# Patient Record
Sex: Female | Born: 1995 | Hispanic: Yes | Marital: Married | Smoking: Never smoker
Health system: Southern US, Community
[De-identification: ages and names within clinical notes are randomized; demographics above are authoritative.]

## PROBLEM LIST (undated history)

## (undated) DIAGNOSIS — I10 Essential (primary) hypertension: Secondary | ICD-10-CM

## (undated) HISTORY — PX: APPENDECTOMY: SHX54

## (undated) HISTORY — DX: Essential (primary) hypertension: I10

---
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
## 2020-09-19 NOTE — L&D Delivery Note (Signed)
OB/GYN Faculty Practice Delivery Note  Melanie Watts is a 25 y.o. G2P1001 s/p NSVD (successful VBAC) at [redacted]w[redacted]d. She was admitted for PROM.   ROM: 12h 22m with clear fluid GBS Status:  Negative/-- (08/05 1058) Maximum Maternal Temperature: 98.49F  Labor Progress: Initial SVE: 1/thick/high. She then progressed to complete.   Delivery Date/Time: 14:37 on 05/04/2021  Delivery: Called to room and patient was complete and pushing. Head delivered LOA. Single loose nuchal cord present, reduced after delivery easily. Shoulder and body delivered in usual fashion. Infant with spontaneous cry, placed on mother's abdomen, dried and stimulated. Cord clamped x 2 after 1-minute delay, and cut by FOB. Cord blood drawn. Placenta delivered spontaneously with gentle cord traction. Fundus firm with massage and Pitocin. Labia, perineum, vagina, and cervix inspected inspected with left periurethral laceration and intact perineum.  Baby Weight: pending  Placenta: Sent to L&D Complications: None Lacerations: Left periurethral repaired with 4-0 monocryl; single figure-eight stitch at hymenal ring for good hemostasis. EBL: 100 mL Analgesia: Epidural   Infant:  APGAR (1 MIN): 9   APGAR (5 MINS): 9   APGAR (10 MINS):     Dr. Dorothyann Gibbs, PGY-1 [facility] Resident present and assisted with delivery. I personally was present and supervised entire delivery, and performed laceration repair.   Jen Mow, DO [facility] for [employer], [facility] 05/04/2021, 3:34 PM

## 2020-11-09 LAB — OB RESULTS CONSOLE VARICELLA ZOSTER ANTIBODY, IGG: Varicella: IMMUNE

## 2020-11-09 LAB — OB RESULTS CONSOLE HIV ANTIBODY (ROUTINE TESTING): HIV: NONREACTIVE

## 2020-11-09 LAB — OB RESULTS CONSOLE RUBELLA ANTIBODY, IGM: Rubella: IMMUNE

## 2020-11-09 LAB — OB RESULTS CONSOLE HGB/HCT, BLOOD
HCT: 37 (ref 29–41)
Hemoglobin: 12.5
Hemoglobin: 12.5

## 2020-11-09 LAB — OB RESULTS CONSOLE ABO/RH: RH Type: POSITIVE

## 2020-11-09 LAB — HEPATITIS C ANTIBODY: HCV Ab: NEGATIVE

## 2020-11-09 LAB — OB RESULTS CONSOLE GC/CHLAMYDIA
Chlamydia: NEGATIVE
Gonorrhea: NEGATIVE

## 2020-11-09 LAB — OB RESULTS CONSOLE RPR: RPR: NONREACTIVE

## 2020-11-09 LAB — CYSTIC FIBROSIS DIAGNOSTIC STUDY: Interpretation-CFDNA:: NEGATIVE

## 2020-11-09 LAB — OB RESULTS CONSOLE ANTIBODY SCREEN: Antibody Screen: NEGATIVE

## 2020-11-09 LAB — AMB REFERRAL TO OB-GYN
Maternal quad screen for MFM: NEGATIVE
Pap: NEGATIVE

## 2020-11-09 LAB — OB RESULTS CONSOLE HEPATITIS B SURFACE ANTIGEN: Hepatitis B Surface Ag: NEGATIVE

## 2020-11-09 LAB — OB RESULTS CONSOLE PLATELET COUNT: Platelets: 231

## 2020-11-09 LAB — SICKLE CELL SCREEN: Sickle Cell Screen: NORMAL

## 2020-12-26 ENCOUNTER — Encounter (HOSPITAL_COMMUNITY): Payer: Self-pay | Admitting: Family Medicine

## 2020-12-26 ENCOUNTER — Inpatient Hospital Stay (HOSPITAL_COMMUNITY)
Admission: AD | Admit: 2020-12-26 | Discharge: 2020-12-26 | Disposition: A | Discharge status: Home / Self Care | Payer: Self-pay | Attending: Family Medicine | Admitting: Family Medicine

## 2020-12-26 ENCOUNTER — Other Ambulatory Visit: Payer: Self-pay

## 2020-12-26 ENCOUNTER — Encounter (HOSPITAL_COMMUNITY): Payer: Self-pay

## 2020-12-26 ENCOUNTER — Ambulatory Visit (HOSPITAL_COMMUNITY)
Admission: EM | Admit: 2020-12-26 | Discharge: 2020-12-26 | Disposition: A | Discharge status: Home / Self Care | Payer: Self-pay

## 2020-12-26 DIAGNOSIS — O09292 Supervision of pregnancy with other poor reproductive or obstetric history, second trimester: Secondary | ICD-10-CM | POA: Insufficient documentation

## 2020-12-26 DIAGNOSIS — O2342 Unspecified infection of urinary tract in pregnancy, second trimester: Secondary | ICD-10-CM | POA: Insufficient documentation

## 2020-12-26 DIAGNOSIS — Z3201 Encounter for pregnancy test, result positive: Secondary | ICD-10-CM

## 2020-12-26 DIAGNOSIS — O209 Hemorrhage in early pregnancy, unspecified: Secondary | ICD-10-CM | POA: Insufficient documentation

## 2020-12-26 DIAGNOSIS — Z3A20 20 weeks gestation of pregnancy: Secondary | ICD-10-CM | POA: Insufficient documentation

## 2020-12-26 DIAGNOSIS — Z9049 Acquired absence of other specified parts of digestive tract: Secondary | ICD-10-CM | POA: Insufficient documentation

## 2020-12-26 LAB — CBC WITH DIFFERENTIAL/PLATELET
Abs Immature Granulocytes: 0.06 10*3/uL (ref 0.00–0.07)
Basophils Absolute: 0.1 10*3/uL (ref 0.0–0.1)
Basophils Relative: 0 %
Eosinophils Absolute: 0.2 10*3/uL (ref 0.0–0.5)
Eosinophils Relative: 1 %
HCT: 36.4 % (ref 36.0–46.0)
Hemoglobin: 12.7 g/dL (ref 12.0–15.0)
Immature Granulocytes: 0 %
Lymphocytes Relative: 22 %
Lymphs Abs: 3.1 10*3/uL (ref 0.7–4.0)
MCH: 30.8 pg (ref 26.0–34.0)
MCHC: 34.9 g/dL (ref 30.0–36.0)
MCV: 88.3 fL (ref 80.0–100.0)
Monocytes Absolute: 0.8 10*3/uL (ref 0.1–1.0)
Monocytes Relative: 6 %
Neutro Abs: 9.8 10*3/uL — ABNORMAL HIGH (ref 1.7–7.7)
Neutrophils Relative %: 71 %
Platelets: 239 10*3/uL (ref 150–400)
RBC: 4.12 MIL/uL (ref 3.87–5.11)
RDW: 13.6 % (ref 11.5–15.5)
WBC: 14 10*3/uL — ABNORMAL HIGH (ref 4.0–10.5)
nRBC: 0 % (ref 0.0–0.2)

## 2020-12-26 LAB — ABO/RH: ABO/RH(D): A POS

## 2020-12-26 LAB — URINALYSIS, ROUTINE W REFLEX MICROSCOPIC
Bilirubin Urine: NEGATIVE
Glucose, UA: NEGATIVE mg/dL
Ketones, ur: NEGATIVE mg/dL
Nitrite: NEGATIVE
Protein, ur: NEGATIVE mg/dL
Specific Gravity, Urine: 1.004 — ABNORMAL LOW (ref 1.005–1.030)
WBC, UA: >50 WBC/hpf — ABNORMAL HIGH (ref 0–5)
pH: 6 (ref 5.0–8.0)

## 2020-12-26 LAB — WET PREP, GENITAL
Clue Cells Wet Prep HPF POC: NONE SEEN
Sperm: NONE SEEN
Trich, Wet Prep: NONE SEEN
Yeast Wet Prep HPF POC: NONE SEEN

## 2020-12-26 LAB — POC URINE PREG, ED: Preg Test, Ur: POSITIVE — AB

## 2020-12-26 MED ORDER — ONDANSETRON 4 MG PO TBDP: 4.0000 mg | ORAL_TABLET | Freq: Four times a day (QID) | ORAL | 0 refills | Status: DC | PRN

## 2020-12-26 MED ORDER — CEFADROXIL 500 MG PO CAPS: 500.0000 mg | ORAL_CAPSULE | Freq: Two times a day (BID) | ORAL | 0 refills | Status: DC

## 2020-12-26 NOTE — ED Triage Notes (Addendum)
Pt presents with burring and pain when urinating; lower abdominal pain and "urethra"  x 3 days. Reports some blood spots in the urine today.  Denies abdominal pain, chills, fever, nausea, vomiting, vaginal discharge.   Pt reports she is 5 months pregnant.

## 2020-12-26 NOTE — MAU Note (Signed)
Pt reports to mau with c/o pain with urination and some spotting when she wipes.  Pt denies abd pain. Pt reports that she gets her care in [place].

## 2020-12-26 NOTE — MAU Provider Note (Addendum)
History     CSN: [id number]  Arrival date and time: 12/26/20 1817   Event Date/Time   First Provider Initiated Contact with Patient 12/26/20 1915      Chief Complaint  Patient presents with  . Dysuria  . Vaginal Bleeding   HPI Melanie Watts is a 25 y.o. G2P1001 at [redacted]w[redacted]d who presents to MAU with chief complaints of dysuria and hematuria. These are new complaints, onset about three days ago. Her pain is suprapubic and only occurs during the initiation of voiding. She denies foul-smelling urine, low back pain, flank pain, abdominal tenderness, fever or recent illness.  Patient's spotting is light and only visualized when she wipes after voiding. She denies vaginal bleeding. She is remote from intercourse.  [facility]. Her next appointment is 01/08/2021.  OB History    Gravida  2   Para  1   Term  1   Preterm      AB      Living  1     SAB      IAB      Ectopic      Multiple      Live Births              Past Medical History:  Diagnosis Date  . Preeclampsia 2015    Past Surgical History:  Procedure Laterality Date  . APPENDECTOMY    . CESAREAN SECTION      History reviewed. No pertinent family history.  Social History   Tobacco Use  . Smoking status: Never Smoker  . Smokeless tobacco: Never Used  Substance Use Topics  . Alcohol use: Never  . Drug use: Never    Allergies:  Allergies  Allergen Reactions  . Salmon Oil [Nutritional Supplements]     Medications Prior to Admission  Medication Sig Dispense Refill Last Dose  . Prenatal MV-Min-Fe Fum-FA-DHA ([employer] PRENATAL MULTI+DHA PO) Take by mouth.       Review of Systems  Genitourinary: Positive for dysuria and hematuria. Negative for flank pain.  All other systems reviewed and are negative.  Physical Exam   Blood pressure 107/64, pulse 77, temperature 98.1 F (36.7 C), temperature source Oral, resp. rate 14, SpO2 99 %.  Physical  Exam Vitals and nursing note reviewed. Exam conducted with a chaperone present.  Constitutional:      Appearance: Normal appearance. She is not ill-appearing.  Cardiovascular:     Rate and Rhythm: Normal rate.     Pulses: Normal pulses.     Heart sounds: Normal heart sounds.  Pulmonary:     Effort: Pulmonary effort is normal.     Breath sounds: Normal breath sounds.  Abdominal:     Tenderness: There is no abdominal tenderness. There is no right CVA tenderness or left CVA tenderness.     Comments: Gravid  Skin:    Capillary Refill: Capillary refill takes less than 2 seconds.  Neurological:     Mental Status: She is alert and oriented to person, place, and time.  Psychiatric:        Mood and Affect: Mood normal.        Behavior: Behavior normal.        Thought Content: Thought content normal.        Judgment: Judgment normal.     MAU Course  Procedures  --Pertinent negatives: CVAT, flank pain, fever --Mild WBC elevation, large leukocytes, rare bacteria. Will initiate treatment, send for culture --Emphasized  importance of finishing entire prescription, even if feeling better.  --Discussed expectation of TOC during her prenatal visit  Patient Vitals for the past 24 hrs:  BP Temp Temp src Pulse Resp SpO2  12/26/20 2005 107/64 98.1 F (36.7 C) Oral 77 14 99 %  12/26/20 1905 109/73 -- -- 77 -- --  12/26/20 1831 118/69 98.2 F (36.8 C) Oral 72 16 100 %   Results for orders placed or performed during the hospital encounter of 12/26/20 (from the past 24 hour(s))  Urinalysis, Routine w reflex microscopic Urine, Clean Catch     Status: Abnormal   Collection Time: 12/26/20  6:52 PM  Result Value Ref Range   Color, Urine STRAW (A) YELLOW   APPearance HAZY (A) CLEAR   Specific Gravity, Urine 1.004 (L) 1.005 - 1.030   pH 6.0 5.0 - 8.0   Glucose, UA NEGATIVE NEGATIVE mg/dL   Hgb urine dipstick LARGE (A) NEGATIVE   Bilirubin Urine NEGATIVE NEGATIVE   Ketones, ur NEGATIVE NEGATIVE  mg/dL   Protein, ur NEGATIVE NEGATIVE mg/dL   Nitrite NEGATIVE NEGATIVE   Leukocytes,Ua LARGE (A) NEGATIVE   RBC / HPF 0-5 0 - 5 RBC/hpf   WBC, UA >50 (H) 0 - 5 WBC/hpf   Bacteria, UA RARE (A) NONE SEEN   Squamous Epithelial / LPF 0-5 0 - 5   Mucus PRESENT   CBC with Differential/Platelet     Status: Abnormal   Collection Time: 12/26/20  7:17 PM  Result Value Ref Range   WBC 14.0 (H) 4.0 - 10.5 K/uL   RBC 4.12 3.87 - 5.11 MIL/uL   Hemoglobin 12.7 12.0 - 15.0 g/dL   HCT 37.6 28.3 - 15.1 %   MCV 88.3 80.0 - 100.0 fL   MCH 30.8 26.0 - 34.0 pg   MCHC 34.9 30.0 - 36.0 g/dL   RDW 76.1 60.7 - 37.1 %   Platelets 239 150 - 400 K/uL   nRBC 0.0 0.0 - 0.2 %   Neutrophils Relative % 71 %   Neutro Abs 9.8 (H) 1.7 - 7.7 K/uL   Lymphocytes Relative 22 %   Lymphs Abs 3.1 0.7 - 4.0 K/uL   Monocytes Relative 6 %   Monocytes Absolute 0.8 0.1 - 1.0 K/uL   Eosinophils Relative 1 %   Eosinophils Absolute 0.2 0.0 - 0.5 K/uL   Basophils Relative 0 %   Basophils Absolute 0.1 0.0 - 0.1 K/uL   Immature Granulocytes 0 %   Abs Immature Granulocytes 0.06 0.00 - 0.07 K/uL  ABO/Rh     Status: None   Collection Time: 12/26/20  7:17 PM  Result Value Ref Range   ABO/RH(D) A POS    No rh immune globuloin      NOT A RH IMMUNE GLOBULIN CANDIDATE, PT RH POSITIVE Performed at [facility], [address], [place], [place] [address]   Wet prep, genital     Status: Abnormal   Collection Time: 12/26/20  7:40 PM  Result Value Ref Range   Yeast Wet Prep HPF POC NONE SEEN NONE SEEN   Trich, Wet Prep NONE SEEN NONE SEEN   Clue Cells Wet Prep HPF POC NONE SEEN NONE SEEN   WBC, Wet Prep HPF POC MANY (A) NONE SEEN   Sperm NONE SEEN    Meds ordered this encounter  Medications  . cefadroxil (DURICEF) 500 MG capsule    Sig: Take 1 capsule (500 mg total) by mouth 2 (two) times daily.  Dispense:  14 capsule    Refill:  0    Order Specific Question:   Supervising Provider    Answer:   Despina Hidden, LUTHER H [[id number]]   . ondansetron (ZOFRAN ODT) 4 MG disintegrating tablet    Sig: Take 1 tablet (4 mg total) by mouth every 6 (six) hours as needed for nausea.    Dispense:  20 tablet    Refill:  0    Order Specific Question:   Supervising Provider    Answer:   Duane Lope H [[id number]]   Assessment and Plan  --25 y.o. G2P1001 at [redacted]w[redacted]d  --FHT 157 by Doppler --UTI, initiate abx --Urine culture in work --Blood type A POS --Language barrier: contract interpreter Okey Regal utilized for all patient interaction --Discharge home in stable conditions with precautions  F/U: --Next appointment with HD is 01/08/2021  Calvert Cantor, CNM 12/26/2020, 8:23 PM

## 2020-12-26 NOTE — ED Notes (Signed)
Pt will go to MAU with husband due to abdominal pian, blood spots in urine, dysuria per [place], [place]. Pt states she is 5 months pregnant.

## 2020-12-26 NOTE — Discharge Instructions (Signed)
Embarazo e infeccin urinaria Pregnancy and Urinary Tract Infection  Una infeccin urinaria (IU) puede ocurrir en cualquier lugar de las vas urinarias. Estas incluyen los riones, los tubos que conectan los riones con la vejiga (urteres), la vejiga y el tubo por el que se elimina la orina del cuerpo (uretra). Estos rganos fabrican, almacenan y eliminan la orina del organismo. El mdico puede usar otras palabras para describir la infeccin. La IU alta afecta los urteres y a los riones (pielonefritis). La IU baja afecta la vejiga (cistitis) y la uretra (uretritis). La mayora de las infecciones de las vas urinarias es causada por la presencia de bacterias en la zona genital, alrededor de la entrada de las vas urinarias (uretra). Estas bacterias proliferan y causan irritacin e inflamacin de las vas urinarias. Usted tiene ms probabilidades de tener una IU durante el embarazo porque los cambios fsicos y hormonales por los que atraviesa el cuerpo pueden hacer que sea ms fcil que las bacterias ingresen en las vas urinarias. El beb en gestacin tambin hace presin sobre la vejiga y puede afectar el flujo de orina. Es importante reconocer y tratar las IU durante el embarazo debido al riesgo de complicaciones graves tanto para usted como para el beb. De qu modo me afecta? Entre los sntomas de una IU, se incluyen los siguientes:  Necesidad inmediata (urgente) de orinar.  Miccin frecuente o eliminacin de pequeas cantidades de orina con frecuencia.  Ardor o dolor al orinar.  Presencia de sangre en la orina.  Orina con mal olor u olor atpico.  Dificultad para orinar.  Orina turbia.  Dolor en el abdomen o en la parte inferior de la espalda.  Secrecin vaginal. Tambin puede presentar:  Vmitos o disminucin del apetito.  Confusin.  Irritabilidad o cansancio.  Fiebre.  Diarrea. Cmo afecta esto al beb? Una IU no tratada durante el embarazo podra ocasionar una  infeccin renal o una infeccin generalizada, lo que puede causar problemas de salud que afecten al beb. Algunas de las complicaciones posibles de una IU no tratada son las siguientes:  Dar a luz al beb antes de las 37semanas de embarazo (prematuro).  Tener un beb con bajo peso al nacer.  Presentar presin arterial alta durante el embarazo (preeclampsia).  Tener un nivel bajo de hemoglobina (anemia). Qu puedo hacer para disminuir el riesgo? Para prevenir la IU, haga lo siguiente:  Vaya al bao en cuanto sienta la necesidad de hacerlo. No contenga la orina durante largos perodos de tiempo.  Lmpiese siempre desde adelante hacia atrs, en especial despus de defecar. Use cada trozo de papel higinico solo una vez cuando se limpie.  Vace la vejiga despus de tener relaciones sexuales.  Mantenga la zona genital seca.  Beba entre 6 y 10vasos de agua por da.  No se haga duchas vaginales ni use desodorantes en aerosol. Cmo se trata? El tratamiento de esta afeccin puede incluir lo siguiente:  Antibiticos cuyo uso es seguro durante el embarazo.  Otros medicamentos para tratar causas menos frecuentes de IU. Siga estas instrucciones en su casa:  Si le recetaron un antibitico, tmelo como se lo haya indicado el mdico. No deje de usar el antibitico aunque comience a sentirse mejor.  Concurra a todas las visitas de seguimiento como se lo haya indicado el mdico. Esto es importante. Comunquese con un mdico si:  Los sntomas no mejoran o empeoran.  Tiene una secrecin vaginal anormal. Solicite ayuda inmediatamente si:  Tiene fiebre.  Tiene nuseas y vmitos.  Siente dolor   en la espalda o en el costado del cuerpo.  Siente contracciones en el tero.  Siente dolor en la parte inferior del abdomen.  Tiene una prdida de lquido abundante por la vagina.  Observa sangre en la orina. Resumen  Una infeccin urinaria (IU) es una infeccin en cualquier parte de las  vas urinarias, que incluyen los riones, los urteres, la vejiga y la uretra.  La mayora de las infecciones de las vas urinarias es causada por la presencia de bacterias en la zona genital, alrededor de la entrada de las vas urinarias (uretra).  Es ms probable presentar una IU durante el embarazo.  Si le recetaron un antibitico, tmelo como se lo haya indicado el mdico. No deje de usar el antibitico aunque comience a sentirse mejor. Esta informacin no tiene como fin reemplazar el consejo del mdico. Asegrese de hacerle al mdico cualquier pregunta que tenga. Document Revised: 09/24/2018 Document Reviewed: 09/24/2018 Elsevier Patient Education  2021 Elsevier Inc.  

## 2020-12-28 LAB — GC/CHLAMYDIA PROBE AMP (~~LOC~~) NOT AT ARMC
Chlamydia: NEGATIVE
Comment: NEGATIVE
Comment: NORMAL
Neisseria Gonorrhea: NEGATIVE

## 2020-12-29 LAB — CULTURE, OB URINE: Culture: >=100000 — AB

## 2020-12-31 ENCOUNTER — Other Ambulatory Visit: Payer: Self-pay | Admitting: Family Medicine

## 2020-12-31 MED ORDER — NITROFURANTOIN MONOHYD MACRO 100 MG PO CAPS: 100.0000 mg | ORAL_CAPSULE | Freq: Two times a day (BID) | ORAL | 0 refills | Status: DC

## 2020-12-31 NOTE — Progress Notes (Signed)
Changing abx to Macrobid per sensitivities

## 2021-01-06 ENCOUNTER — Encounter: Payer: Self-pay | Admitting: *Deleted

## 2021-01-07 LAB — AMB REFERRAL TO OB-GYN
Drug Screen, Urine: NEGATIVE
Urine Culture, OB: NEGATIVE

## 2021-01-13 ENCOUNTER — Telehealth (INDEPENDENT_AMBULATORY_CARE_PROVIDER_SITE_OTHER): Payer: Self-pay

## 2021-01-13 VITALS — Ht 62.0 in

## 2021-01-13 DIAGNOSIS — Z98891 History of uterine scar from previous surgery: Secondary | ICD-10-CM

## 2021-01-13 DIAGNOSIS — Z87898 Personal history of other specified conditions: Secondary | ICD-10-CM

## 2021-01-13 DIAGNOSIS — Z3490 Encounter for supervision of normal pregnancy, unspecified, unspecified trimester: Secondary | ICD-10-CM

## 2021-01-13 DIAGNOSIS — Z8759 Personal history of other complications of pregnancy, childbirth and the puerperium: Secondary | ICD-10-CM | POA: Insufficient documentation

## 2021-01-13 DIAGNOSIS — O0990 Supervision of high risk pregnancy, unspecified, unspecified trimester: Secondary | ICD-10-CM | POA: Insufficient documentation

## 2021-01-13 NOTE — Progress Notes (Signed)
Called pt with Spanish Interpreter Eda R. and informed the patient that we have her records from the [facility] and the need for this intake appt is not necessary.  If she could please come to her appt scheduled on 01/21/21 with Dr. Alysia Penna so that she can be seen by a provider.  Pt verbalized understanding with no further questions.   Leonette Nutting  01/13/21

## 2021-01-21 ENCOUNTER — Other Ambulatory Visit: Payer: Self-pay

## 2021-01-21 ENCOUNTER — Ambulatory Visit (INDEPENDENT_AMBULATORY_CARE_PROVIDER_SITE_OTHER): Payer: Self-pay | Admitting: Obstetrics and Gynecology

## 2021-01-21 ENCOUNTER — Encounter: Payer: Self-pay | Admitting: Obstetrics and Gynecology

## 2021-01-21 VITALS — BP 106/68 | HR 53 | Wt 165.1 lb

## 2021-01-21 DIAGNOSIS — Z3490 Encounter for supervision of normal pregnancy, unspecified, unspecified trimester: Secondary | ICD-10-CM

## 2021-01-21 DIAGNOSIS — Z8759 Personal history of other complications of pregnancy, childbirth and the puerperium: Secondary | ICD-10-CM

## 2021-01-21 DIAGNOSIS — Z87898 Personal history of other specified conditions: Secondary | ICD-10-CM

## 2021-01-21 DIAGNOSIS — Z98891 History of uterine scar from previous surgery: Secondary | ICD-10-CM

## 2021-01-21 MED ORDER — ASPIRIN EC 81 MG PO TBEC: 81.0000 mg | DELAYED_RELEASE_TABLET | Freq: Every day | ORAL | 2 refills | Status: DC

## 2021-01-21 NOTE — Progress Notes (Signed)
Subjective:  Spirit Wernli is a 25 y.o. G2P1001 at 33w5dbeing seen today for ongoing prenatal care. [facility]o [facility] at term with c section at term [place] Took BP meds x 2 months PP.  She is currently monitored for the following issues for this high-risk pregnancy and has Supervision of low-risk pregnancy; History of low birth weight; History of pre-eclampsia; and History of C-section on their problem list.  Patient reports no complaints.  Contractions: Not present. Vag. Bleeding: None.  Movement: Present. Denies leaking of fluid.   The following portions of the patient's history were reviewed and updated as appropriate: allergies, current medications, past family history, past medical history, past social history, past surgical history and problem list. Problem list updated.  Objective:   Vitals:   01/21/21 1114  BP: 106/68  Pulse: (!) 53  Weight: 165 lb 1.6 oz (74.9 kg)    Fetal Status: Fetal Heart Rate (bpm): 141   Movement: Present     General:  Alert, oriented and cooperative. Patient is in no acute distress.  Skin: Skin is warm and dry. No rash noted.   Cardiovascular: Normal heart rate noted  Respiratory: Normal respiratory effort, no problems with respiration noted  Abdomen: Soft, gravid, appropriate for gestational age. Pain/Pressure: Absent     Pelvic:  Cervical exam deferred        Extremities: Normal range of motion.  Edema: None  Mental Status: Normal mood and affect. Normal behavior. Normal judgment and thought content.   Urinalysis:      Assessment and Plan:  Pregnancy: G2P1001 at 273w5d1. Encounter for supervision of low-risk pregnancy, antepartum Prenatal care and labs reviewed with pt. Glucola next visit  2. History of [facility]  3. History of pre-eclampsia Reviewed with pt. Start BASA qd, indications reviewed - Protein / creatinine ratio, urine - Comp Met  (CMET) - aspirin EC 81 MG tablet; Take 1 tablet (81 mg total) by mouth daily. Take after 12 weeks for prevention of preeclampsia later in pregnancy  Dispense: 300 tablet; Refill: 2 - [place] OB FOLLOW UP; Future  4. History of low birth weight Growth scans  5. Language Barrier Live interrupter used during today's visit  Preterm labor symptoms and general obstetric precautions including but not limited to vaginal bleeding, contractions, leaking of fluid and fetal movement were reviewed in detail with the patient. Please refer to After Visit Summary for other counseling recommendations.  Return in about 4 weeks (around 02/18/2021) for OB visit, face to face, MD only, Glucola.   ErChancy MilroyMD

## 2021-01-21 NOTE — Patient Instructions (Addendum)
Segundo trimestre de embarazo Second Trimester of Pregnancy  El segundo trimestre de embarazo va desde la semana 13 hasta la semana 27. Tambin se dice que va desde el mes 4 hasta el mes 6 de embarazo. Este suele ser el momento en el que mejor se siente. Durante el segundo trimestre:  Las nuseas del embarazo han disminuido o han desaparecido.  Usted puede tener ms energa.  Usted puede tener hambre con ms frecuencia. En esta poca, el beb en gestacin (feto) crece muy rpido. Hacia el final del sexto mes, el beb en gestacin puede medir aproximadamente 12 pulgadas y pesar alrededor de 1 libras. Es probable que comience a sentir que el beb se mueve entre las 16 y las 20 semanas de embarazo. Cambios en el cuerpo durante el segundo trimestre Su organismo contina atravesando por muchos cambios durante este perodo. Los cambios varan y generalmente vuelven a la normalidad despus del nacimiento del beb. Cambios fsicos  Aumentar ms peso.  Podrn aparecer las primeras estras en las caderas, el vientre (abdomen) y las mamas.  Las mamas crecern y pueden doler.  Pueden aparecer zonas oscuras o manchas en el rostro.  Es posible que se forme una lnea oscura desde el ombligo hasta la zona del pubis (linea nigra).  Tal vez haya cambios en el cabello. Cambios en la salud  Es posible que tenga dolores de cabeza.  Es posible que tenga acidez estomacal.  Es posible que tenga dificultades para defecar (estreimiento).  Es posible que tenga hemorroides o venas abultadas e hinchadas (venas varicosas).  Las encas pueden sangrarle.  Es posible que haga pis (orine) con mayor frecuencia.  Puede sentir dolor en la espalda. Siga estas instrucciones en su casa: Medicamentos  Use los medicamentos de venta libre y los recetados solamente como se lo haya indicado el mdico. Algunos medicamentos no son seguros durante el embarazo.  Tome vitaminas prenatales que contengan por lo menos  600microgramos (mcg) de cido flico. Comida y bebida  Consuma comidas saludables que incluyan lo siguiente: ? Frutas y verduras frescas. ? Cereales integrales. ? Buenas fuentes de protenas, como carne, huevos y tofu. ? Productos lcteos con bajo contenido de grasa.  Evite la carne cruda y el jugo, la leche y el queso sin pasteurizar.  Es posible que deba tomar medidas para prevenir o tratar los problemas para defecar: ? Beber suficiente lquido para mantener el pis (orina) de color amarillo plido. ? Come alimentos ricos en fibra. Entre ellos, frijoles, cereales integrales y frutas y verduras frescas. ? Limitar los alimentos con alto contenido de grasa y azcar. Estos incluyen alimentos fritos o dulces. Actividad  Haga ejercicios solamente como se lo haya indicado el mdico. La mayora de las personas pueden realizar su actividad fsica habitual durante el embarazo. Intente realizar como mnimo 30minutos de actividad fsica por lo menos 5das a la semana.  Deje de hacer ejercicio si tiene dolor o clicos en el vientre o en la zona lumbar.  No haga ejercicio si hace demasiado calor, hay demasiada humedad o se encuentra en un lugar de mucha altura (altitud elevada).  Evite levantar pesos excesivos.  Si lo desea, puede continuar teniendo relaciones sexuales, a menos que el mdico le indique lo contrario. Alivio del dolor y del malestar  Use un sostn que le brinde buen soporte si le duelen las mamas.  Dese baos de asiento con agua tibia para aliviar el dolor o las molestias causadas por las hemorroides. Use una crema para las hemorroides si   el mdico la autoriza.  Descanse con las piernas levantadas (elevadas) si tiene calambres en las piernas o dolor en la parte baja de la espalda.  Si desarrolla venas abultadas en las piernas: ? Use medias de compresin segn las indicaciones de su mdico. ? Levante los pies durante 15minutos, 3 o 4veces por da. ? Limite la sal en sus  alimentos. Seguridad  Use el cinturn de seguridad en todo momento mientras vaya en auto.  Hable con el mdico si alguien le est haciendo dao o gritando mucho. Estilo de vida  No se d baos de inmersin en agua caliente, baos turcos ni saunas.  No se haga duchas vaginales. No use tampones ni toallas higinicas perfumadas.  Evite el contacto con las bandejas sanitarias de los gatos y la tierra que estos animales usan. Estos contienen grmenes que pueden daar al beb y causar la prdida del beb ya sea aborto espontneo o muerte fetal.  No consuma medicamentos a base de hierbas, drogas ilegales, ni medicamentos que el mdico no haya autorizado. No beba alcohol.  No fume ni consuma ningn producto que contenga nicotina o tabaco. Si necesita ayuda para dejar de fumar, consulte al mdico. Instrucciones generales  Cumpla con todas las visitas de seguimiento. Esto es importante.  Consulte a su mdico acerca de dnde se dictan clases prenatales cerca de donde vive.  Consulte a su mdico sobre los alimentos que debe comer o pdale que la ayude a encontrar a un asesor. Dnde buscar ms informacin  American Pregnancy Association (Asociacin Americana del Embarazo): americanpregnancy.org  American College of Obstetricians and Gynecologists (Colegio Estadounidense de Obstetras y Gineclogos): www.acog.org  Office on Women's Health (Oficina para la Salud de la Mujer): womenshealth.gov/pregnancy Comunquese con un mdico si:  Tiene un dolor de cabeza que no desaparece despus de tomar analgsicos.  Nota cambios en la visin o ve manchas delante de los ojos.  Tiene clicos o siente presin o dolor leves en la parte baja del vientre.  Sigue sintiendo como si fuera a vomitar (nuseas), vomita o hace deposiciones acuosas (diarrea).  Advierte lquido con mal olor que proviene de la vagina.  Siente dolor al orinar o hace orina con mal olor.  Tiene una gran hinchazn en la cara, las  manos, las piernas, los tobillos o los pies.  Tiene fiebre. Solicite ayuda de inmediato si:  Tiene una prdida de lquido por la vagina.  Tiene sangrado o pequeas prdidas vaginales.  Tiene clicos o dolor muy intensos en el vientre.  Tiene dificultad para respirar.  Sientes dolor en el pecho.  Se desmaya.  No ha sentido que el beb se moviera durante el perodo de tiempo que le dijo el mdico.  Tiene dolor, hinchazn o enrojecimiento nuevos en un brazo o una pierna o se produce un aumento de alguno de estos sntomas. Resumen  El segundo trimestre de embarazo va desde la semana 13 hasta la 27 (desde el mes 4 hasta el 6).  Consuma comidas saludables.  Haga ejercicios tal como le indic el mdico. La mayora de las personas pueden realizar su actividad fsica habitual durante el embarazo.  No consuma medicamentos a base de hierbas, drogas ilegales, ni medicamentos que el mdico no haya autorizado. No beba alcohol.  Llame al mdico si se enferma o si nota algo inusual acerca de su embarazo. Esta informacin no tiene como fin reemplazar el consejo del mdico. Asegrese de hacerle al mdico cualquier pregunta que tenga. Document Revised: 03/20/2020 Document Reviewed: 03/20/2020 Elsevier Patient Education    2021 Elsevier Inc. AREA PEDIATRIC/FAMILY PRACTICE PHYSICIANS  [place] ([address]) . [facility] Melodie Bouillon, MD; Lum Babe, MD; Sheffield Slider, MD; Leveda Anna, MD; McDiarmid, MD; Jerene Bears, MD; Jennette Kettle, MD; Gwendolyn Grant, MD o [address] [place], [place] [address] o [phone] o Mon-Fri 8:30-12:30, 1:30-5:00 o Providers come to see babies at [facility] o Accepting Medicaid . [facility] o Limited providers who accept newborns: Docia Chuck, MD; Kateri Plummer, MD; Paulino Rily, MD o [address], [place], [place] [address] o [phone] o Mon-Fri 8:00-5:30 o Babies seen by providers at [facility] o Does NOT accept  Medicaid o Please call early in hospitalization for appointment (limited availability)  . [facility] Fatima Sanger, MD o [address] [place], [place] [address] o [phone] o Mon, Tue, Thur, Fri 8:30-5:00, Wed 10:00-7:00 (closed 1-2pm) o Babies seen by [facility] providers o Accepting Medicaid . Donnie Coffin - Pediatrician Fae Pippin, MD o [address], [place], [place] [address] o [phone] o Mon-Fri 8:30-5:00, Sat 8:30-12:00 o Provider comes to see babies at [facility] o Accepting Medicaid o Must have been referred from current patients or contacted office prior to delivery . [facility]) Leotis Pain, MD; Ave Filter, MD; Luna Fuse, MD; Kennedy Bucker, MD; Konrad Dolores, MD; Kathlene November, MD; Jenne Campus, MD; Lubertha South, MD; Wynetta Emery, MD; Duffy Rhody, MD; Gerre Couch, NP; Shirl Harris, NP o [address] [place], [place], [place] [address] o [phone] o Mon, Tue, Thur, Fri 8:30-5:30, Wed 9:30-5:30, Sat 8:30-12:30 o Babies seen by [facility] providers o Accepting Medicaid o Only accepting infants of first-time parents or siblings of current patients [facility] discharge coordinator will make follow-up appointment . Cyril Mourning o [address], [place], [place]  [address] o [phone]   Fax - [phone] . [facility] o [address], [place], [place]  [address] o Phone - [phone]   Fax - [phone] . Lucio Edward o [address], [place], [place]  [address] o [phone]  [place] [phone]) . [facility] Jorge Mandril, MD; Alita Chyle, MD; Princella Ion, MD; MD; Earlene Plater, MD; Jamesetta Orleans, MD; Alvera Novel, MD; Clarene Duke, MD; Rana Snare, MD; Carmon Ginsberg, MD; Alinda Money, MD; Hosie Poisson, MD; Mayford Knife, MD o [address], [place], [place] [address] o [phone] o Mon-Fri 8:30-5:00 (extended evenings Mon-Thur as needed), Sat-Sun 10:00-1:00 o Providers come to see babies at [facility] o Accepting Medicaid for families of first-time babies and families with all children in the household age 55 and under. Must register with office prior to making appointment (M-F only). [facility] Odella Aquas, NP; Lynelle Doctor, MD; Susann Givens, MD; [place], [place] o [address] [place], [place] [address] o [phone] o Mon-Fri 8:00-5:00 o Babies seen by providers at [facility] o Does NOT accept Medicaid/Commercial Insurance Only . [facility] [place] [facility])  Suzette Battiest, MD; Zachery Dauer, MD; Stefan Church, MD; Sabino Dick, MD; Quitman Livings, MD; Farris Has, MD; Gaynell Face, MD; Betha Loa, MD; Colon Flattery, MD; Clifton James, MD o [address] [place] [place], [place] [address] o [phone] o Mon-Fri 8:30-5:30, Sat (Oct.-Mar.) 9:00-1:00 o Babies seen by providers at [facility] o Accepting [facility] [phone]) . [facility] Dimitri, MD; Sheliah Hatch, MD o [address], [place], [place] [address] o [phone] o Mon-Fri 8:30-5:00, Sat 8:30-12:00 o Providers come to see babies at [facility] o Does NOT accept Medicaid .  [facility] Cindy Hazyo Becker, [place]; Andrews AFBHagler, MD; FaxonScifres, [place]; Wynelle LinkSun, MD; Azucena CecilSwayne, MD o [address], [place], [place] [address] o [phone] o Mon-Fri 8:00-5:00 o Babies seen by providers at [facility] o Does NOT accept Medicaid o Only accepting babies of parents who are patients o Please call early in hospitalization for appointment (limited availability) . [facility] Lamar Beneso Clark, MD; Abran CantorFrye, MD; Early OsmondKelleher, MD; Cherre HugerMack, NP; Hyacinth MeekerMiller, MD; Dwan Bolt'Keller, MD; Jarold MottoPatterson, NP; Dario GuardianPudlo, MD; Talmage NapPuzio, MD; Maisie Fushomas, MD; Pricilla Holmucker, MD; Tama Highwiselton, MD o [address], [place], [place] [address] o [phone] o Mon-Fri 8:00-5:00, Sat 9:00-12:00 o Providers come to see babies at [facility] o Does NOT accept [facility]  [place] [phone]) . [facility] o Limited providers accepting new patients: Drema PryBrake, NP; Lime VillageWharton, PA o [address], [place], [place] [address] o [phone] o Mon-Fri 8:00-5:00 o Babies seen by providers at [facility] o Does NOT accept Medicaid o Only accepting babies of parents who are patients o Please call early in hospitalization for appointment (limited availability) . [facility] Luan Pullingo Gay, MD; Nash DimmerQuinlan, MD o [address] [place], [place] [address] o [phone] (press 1 to schedule appointment) o Mon-Fri 8:00-5:00 o Providers come to see babies at [facility] o Does NOT accept Medicaid . [facility] Cristino Marteso Mazer, MD o [address] [place], [place] [address] o [phone] o Mon-Fri 8:30-5:00 (lunch 12:30-1:00), extended hours by appointment only Wed 5:00-6:30 o Babies seen by [facility] providers o Accepting Medicaid . [facility] o Banks, MD; SwazilandJordan, MD; Hassan RowanKoberlein, MD o [address], [place], [place] [address] o [phone] o Mon-Fri 8:00-5:00 o Babies seen by [facility] providers o Does NOT accept Medicaid . [facility] [address] [place] Sohoo Parker, MD; Durene CalHunter, MD; Rich[place] HillsWallace, DO o [address] [place], [place] [address] o [phone] o Mon-Fri 8:00-5:00 o Babies seen by [facility] providers o Does NOT accept Medicaid . [facility] o SherwoodBrandon, [place]; Twin LakeBrecken, [place]; Rennerdalehristy, NP; Avis Epleyees, MD; Vonna KotykeClaire, MD; Clance BolleWeese, MD; Stevphen RochesterHansen, NP; Arvilla MarketMills, NP; Ann MakiParrish, NP; Otis DialsSmoot, NP; Vaughan BastaSummer, MD; RinglingVapne, MD o [address] [place], [place] [address] o [phone] o Mon-Fri 8:30-5:00, Sat 10:00-1:00 o Providers come to see babies at [facility] o Does NOT accept Medicaid o Free prenatal information session Tuesdays at 4:45pm . [facility] Luna Kitchenso Bouska, MD; KerseyGordon, [place]; [place]ery, [place]; Weber, [place] o [address] [place] [address] o [phone] o Mon-Fri  7:30-5:30 o Babies seen by [facility] providers . [facility] o [address], [place], [place]  [address] o [phone]   Fax - [phone]  [place] [phone] & [phone]) . [facility] Alphonsa Overallo Reese, MD o [address] [place], [place] [address] o [phone] o Mon-Thur 8:00-6:00 o Providers come to see babies at [facility] o Accepting Medicaid . [facility] Zenon Mayoo Anderson, NP; Cyndia BentBadger, MD; Apal[place], [place]; Noroton HeightsSpencer, [place] o [address] [place], [place] [address] o [phone] o Mon-Thur 7:30-7:30, Fri 7:30-4:30 o Babies seen by [facility] providers o Accepting Medicaid . [facility] Cheryle Horsfallo Agbuya, MD; Janene HarveyKlett, NP; Vonita Mossomgoolam, MD o [address], [place], [place] [address] o [phone] o Mon-Fri 8:30-5:00, Sat 8:30-12:00 o Providers come to see babies at [facility] o Accepting Medicaid o Must have "Meet & Greet" appointment at office prior to delivery . [facility] [place] [facility] Llana Aliment, MD; Earlene Plater, MD; Lucretia Roers, MD o [address], [place], [place] [address] o [phone] o Mon-Wed 8:00-6:00, Thur-Fri 8:00-5:00, Sat 9:00-12:00 o Providers come to see babies at [facility] o Does NOT accept Medicaid o Only accepting siblings of current patients . [facility]  o [address], [place], [place]  [address] o [phone]   Fax - [phone] . [facility] at [facility] o [phone] [address], [place], [place]  [address] o [phone]   Fax - [phone]  [place] [phone] & [phone]) . Nature conservation officer at [facility] o [place], DO; [place], DO o [address] [place], [place] [address] o [phone] o Mon-Fri 7:00-5:00 o Babies seen by [facility] providers o Does NOT accept Medicaid . [facility] Ellis Savage, MD; [place], [place]; [place], [place] o [address], [place], [place] [address] o [phone] o Mon-Fri 8:00-5:00 o Babies seen by [facility] providers o Accepting Medicaid . [facility] - [address] Franne Forts, MD; [place], [place]; [place], NP; [place], [place] o [address] [place], [place], [place] [address] o [phone] o Mon-Fri 8:00-5:00 o Babies seen by providers at [facility] o Accepting [facility] [phone]) . [facility] [place], [place] o [address] [place], [place] [address] o [phone] o Mon-Fri 8:00-5:00 o Babies seen by [facility] providers o Does NOT accept Medicaid o Limited availability, please call early in hospitalization to schedule follow-up . [facility] Jolee Ewing, PA; Eddie Candle, MD; [place], MD; [place], [place]; Constance Goltz, MD; [place], [place] o [facility] [address] [place] [address], [place], [place] [address] o [phone] o Mon-Fri 8:30-5:00, Sat 9:00-12:00 o Babies seen by providers at [facility] o Accepting Medicaid o Please register online then schedule online or call office o [email or web address] . [facility]) Samuella Bruin, NP; Lucianne Muss, MD; Lanier Clam, PA o [address], [place], [place] [address] o [phone] o Mon-Fri 8:00-5:00 o Babies seen by providers at [facility] o Accepting Medicaid . [facility]) Sharin Mons, MD; Reed Breech, NP; Shelva Majestic, MD o [address], [place], [place] [address] o [phone] o Mon-Fri 8:00-5:30, Sat&Sun by appointment (phones open at 8:30) o Babies seen by [facility] providers o Accepting Medicaid o Must be a first-time baby or sibling of current patient . [facility] [address], [place], [place]   [address] o [phone]   Fax - [phone]  [place] [phone] & [phone]) . [facility] o [place], [place]; [place], [place]; Dimple Casey, MD; [place], [place]; Carolyne Fiscal, MD o [address] [place], [place] [address] o [phone] o Mon-Thur 8:00-7:00, Fri 8:00-5:00, Sat 8:00-12:00, Sun 9:00-12:00 o Babies seen by [facility] providers o Accepting Medicaid . [facility], MD; Gaynell Face, MD; [facility], MD o [address], [place], [place] [address] o [phone] o Mon-Thur 8:00-5:00 o Babies seen by providers at [facility] o Accepting Medicaid . [facility] [place] Gwenlyn Saran, MD; Coe-Goins, MD; Madilyn Fireman, MD; Melvyn Neth, MD; List, MD; Lazarus Salines, MD; Gaynell Face, MD; Berneda Rose, MD; Flora Lipps, MD; Beryl Meager, MD; Luther Redo, MD; Lavonia Drafts, MD; Kellie Simmering, MD o [address] [place], [place] [address] o [phone] o Mon-Fri 8:00-5:30, Sat (Oct.-Mar.) 9:00-1:00 o Babies seen by providers at [facility] o Accepting Medicaid o Must fill out new patient packet, available online at [email or web address] . [facility]) Simone Curia, NP; Tiburcio Pea, NP; Tresa Endo, NP; Whitney Post, MD; [place], [place]; Hennie Duos, MD; [place], MD; Kavin Leech, NP o [address], [place], [place] [address] o [phone] o Mon-Thur 8:00-5:30, Fri 8:00-5:00 o Babies seen by providers at [facility] o Accepting [facility] [phone]) . [facility] o [place], [place]; [place], MD; Tanya Nones, MD; [place], [place] o [address] [place], [place] [address] o [phone] o Mon-Fri 8:00-5:00 o Babies seen by providers at [facility] o Accepting [facility] [phone]) . [facility] at [facility] o [place], DO; Lenise Arena, MD; [place], [place] o [address], [place], [place] [address] o [phone] o Mon-Fri 8:00-5:00 o Babies  seen by providers at [facility] o Does NOT accept Medicaid o Limited appointment availability, please call early in hospitalization  . Nature conservation officer at [facility] o [place], DO; [place], MD o [address], [place], [place] [address] o [phone] o Mon-Fri 8:00-5:00 o Babies seen by [facility] providers o Does NOT accept Medicaid . [facility] Lorrine Kin, MD; Ninetta Lights, MD; [place], [place]; [place], MD o [address], [place], [place] [address] o [phone] o Mon-Fri 8:00-5:00 o After hours clinic [facility] [place], [place] [address]) [phone] Mon-Fri 5:00-8:00, Sat 12:00-6:00, Sun 10:00-4:00 o Babies seen by [facility] providers o Accepting Medicaid . [facility] o [address] [place], [place]  [address] o [phone]   Fax - [phone]  [place] [phone]) . Nature conservation officer at [facility], MD o [address] [place], [place], [place] [address] o [phone] o Mon-Fri 8:00-5:00 o Babies seen by [facility] providers o Does NOT accept Medicaid . [facility] [place] [facility]) Tomi Likens, MD o [address] [place] [address], [place], [place] [address] o [phone] o Mon-Thur 8:00-7:00, Fri 8:00-5:00, Sat 8:00-12:00 o Babies seen by providers at [facility] o Accepting Medicaid - but does not have vaccinations in office (must be received elsewhere) o Limited availability, please call early in hospitalization  [place] ([phone]) . [facility]  o Wyvonne Lenz, MD o [address], [place] [address] o [phone]  Fax [phone]

## 2021-01-22 LAB — COMPREHENSIVE METABOLIC PANEL
ALT: 29 IU/L (ref 0–32)
AST: 15 IU/L (ref 0–40)
Albumin/Globulin Ratio: 1.4 (ref 1.2–2.2)
Albumin: 3.7 g/dL — ABNORMAL LOW (ref 3.9–5.0)
Alkaline Phosphatase: 55 IU/L (ref 44–121)
BUN/Creatinine Ratio: 13 (ref 9–23)
BUN: 7 mg/dL (ref 6–20)
Bilirubin Total: <0.2 mg/dL (ref 0.0–1.2)
CO2: 20 mmol/L (ref 20–29)
Calcium: 9.1 mg/dL (ref 8.7–10.2)
Chloride: 102 mmol/L (ref 96–106)
Creatinine, Ser: 0.54 mg/dL — ABNORMAL LOW (ref 0.57–1.00)
Globulin, Total: 2.7 g/dL (ref 1.5–4.5)
Glucose: 81 mg/dL (ref 65–99)
Potassium: 4 mmol/L (ref 3.5–5.2)
Sodium: 138 mmol/L (ref 134–144)
Total Protein: 6.4 g/dL (ref 6.0–8.5)
eGFR: 133 mL/min/{1.73_m2} (ref >59)

## 2021-01-22 LAB — PROTEIN / CREATININE RATIO, URINE
Creatinine, Urine: 33.9 mg/dL
Protein, Ur: 4.4 mg/dL
Protein/Creat Ratio: 130 mg/g creat (ref 0–200)

## 2021-02-10 ENCOUNTER — Other Ambulatory Visit: Payer: Self-pay | Admitting: *Deleted

## 2021-02-10 DIAGNOSIS — O0990 Supervision of high risk pregnancy, unspecified, unspecified trimester: Secondary | ICD-10-CM

## 2021-02-10 DIAGNOSIS — Z8759 Personal history of other complications of pregnancy, childbirth and the puerperium: Secondary | ICD-10-CM

## 2021-02-10 NOTE — Progress Notes (Signed)
Received call from MFM need [place] changed as patient has not had [place] in MFM this pregnancy. Earnest Mcgillis,RN

## 2021-02-18 ENCOUNTER — Encounter: Payer: Self-pay | Admitting: Obstetrics and Gynecology

## 2021-02-18 ENCOUNTER — Other Ambulatory Visit: Payer: Self-pay

## 2021-02-18 ENCOUNTER — Ambulatory Visit (INDEPENDENT_AMBULATORY_CARE_PROVIDER_SITE_OTHER): Payer: Self-pay | Admitting: Obstetrics and Gynecology

## 2021-02-18 VITALS — BP 104/67 | HR 67 | Wt 167.7 lb

## 2021-02-18 DIAGNOSIS — Z23 Encounter for immunization: Secondary | ICD-10-CM

## 2021-02-18 DIAGNOSIS — Z8759 Personal history of other complications of pregnancy, childbirth and the puerperium: Secondary | ICD-10-CM

## 2021-02-18 DIAGNOSIS — O0990 Supervision of high risk pregnancy, unspecified, unspecified trimester: Secondary | ICD-10-CM

## 2021-02-18 DIAGNOSIS — Z789 Other specified health status: Secondary | ICD-10-CM | POA: Insufficient documentation

## 2021-02-18 DIAGNOSIS — Z98891 History of uterine scar from previous surgery: Secondary | ICD-10-CM

## 2021-02-18 DIAGNOSIS — Z87898 Personal history of other specified conditions: Secondary | ICD-10-CM

## 2021-02-18 DIAGNOSIS — Z3A27 27 weeks gestation of pregnancy: Secondary | ICD-10-CM

## 2021-02-18 MED ORDER — CVS PRENATAL MULTI+DHA 27-0.8-250 MG PO CAPS: 1.0000 | ORAL_CAPSULE | Freq: Every day | ORAL | 1 refills | Status: DC

## 2021-02-18 NOTE — Progress Notes (Signed)
    PRENATAL VISIT NOTE  Subjective:  Melanie Watts is a 25 y.o. G2P1001 at [redacted]w[redacted]d being seen today for ongoing prenatal care.  She is currently monitored for the following issues for this high-risk pregnancy and has Supervision of high risk pregnancy, antepartum; History of low birth weight; History of pre-eclampsia; History of C-section; and Language barrier on their problem list.  Patient reports no complaints.  Contractions: Not present. Vag. Bleeding: None.  Movement: Present. Denies leaking of fluid.   The following portions of the patient's history were reviewed and updated as appropriate: allergies, current medications, past family history, past medical history, past social history, past surgical history and problem list.   Objective:   Vitals:   02/18/21 0842  BP: 104/67  Pulse: 67  Weight: 167 lb 11.2 oz (76.1 kg)    Fetal Status: Fetal Heart Rate (bpm): 158   Movement: Present     General:  Alert, oriented and cooperative. Patient is in no acute distress.  Skin: Skin is warm and dry. No rash noted.   Cardiovascular: Normal heart rate noted  Respiratory: Normal respiratory effort, no problems with respiration noted  Abdomen: Soft, gravid, appropriate for gestational age.  Pain/Pressure: Absent     Pelvic: Cervical exam deferred        Extremities: Normal range of motion.  Edema: None  Mental Status: Normal mood and affect. Normal behavior. Normal judgment and thought content.   Assessment and Plan:  Pregnancy: G2P1001 at [redacted]w[redacted]d 1. Supervision of high risk pregnancy, antepartum 28wk labs today tdap done today  2. History of C-section Pt states c/s was a few days before her due date and was for NRFHT and she was never told she had to have another one.   I told her we will follow up with her nv but I d/w her re: r/b of tolac vs scheduled c/s mainly 1% risk of uterine rupture and slightly higher with IOL.   3. History of low birth weight Has mfm anatomy u/s on 6/9.  Recommend them q4-6wk, FH normal today  4. History of pre-eclampsia Continue low dose asa  5. Language barrier Interpreter used.   6. [redacted] weeks gestation of pregnancy  Preterm labor symptoms and general obstetric precautions including but not limited to vaginal bleeding, contractions, leaking of fluid and fetal movement were reviewed in detail with the patient. Please refer to After Visit Summary for other counseling recommendations.   No follow-ups on file.  Future Appointments  Date Time Provider Department Center  02/18/2021  8:55 AM Amo Bing, MD [facility]  02/25/2021  8:30 AM [facility] NURSE [facility]  02/25/2021  8:45 AM [facility] US5 [facility]    [place] Bing, MD

## 2021-02-18 NOTE — Progress Notes (Signed)
Pt requests refill on PNV.

## 2021-02-19 LAB — CBC
Hematocrit: 35.6 % (ref 34.0–46.6)
Hemoglobin: 12.2 g/dL (ref 11.1–15.9)
MCH: 30.8 pg (ref 26.6–33.0)
MCHC: 34.3 g/dL (ref 31.5–35.7)
MCV: 90 fL (ref 79–97)
Platelets: 201 10*3/uL (ref 150–450)
RBC: 3.96 x10E6/uL (ref 3.77–5.28)
RDW: 12.7 % (ref 11.7–15.4)
WBC: 10.8 10*3/uL (ref 3.4–10.8)

## 2021-02-19 LAB — GLUCOSE TOLERANCE, 2 HOURS W/ 1HR
Glucose, 1 hour: 139 mg/dL (ref 65–179)
Glucose, 2 hour: 84 mg/dL (ref 65–152)
Glucose, Fasting: 73 mg/dL (ref 65–91)

## 2021-02-19 LAB — HIV ANTIBODY (ROUTINE TESTING W REFLEX): HIV Screen 4th Generation wRfx: NONREACTIVE

## 2021-02-19 LAB — RPR: RPR Ser Ql: NONREACTIVE

## 2021-02-22 NOTE — Progress Notes (Signed)
Screened 01/21/21 

## 2021-02-25 ENCOUNTER — Ambulatory Visit: Payer: Self-pay | Admitting: *Deleted

## 2021-02-25 ENCOUNTER — Encounter: Payer: Self-pay | Admitting: *Deleted

## 2021-02-25 ENCOUNTER — Ambulatory Visit: Payer: Self-pay | Attending: Obstetrics and Gynecology

## 2021-02-25 ENCOUNTER — Other Ambulatory Visit: Payer: Self-pay

## 2021-02-25 ENCOUNTER — Other Ambulatory Visit: Payer: Self-pay | Admitting: *Deleted

## 2021-02-25 VITALS — BP 109/67 | HR 81

## 2021-02-25 DIAGNOSIS — Z98891 History of uterine scar from previous surgery: Secondary | ICD-10-CM | POA: Insufficient documentation

## 2021-02-25 DIAGNOSIS — Z8759 Personal history of other complications of pregnancy, childbirth and the puerperium: Secondary | ICD-10-CM

## 2021-02-25 DIAGNOSIS — Z362 Encounter for other antenatal screening follow-up: Secondary | ICD-10-CM

## 2021-02-25 DIAGNOSIS — Z87898 Personal history of other specified conditions: Secondary | ICD-10-CM | POA: Insufficient documentation

## 2021-02-25 DIAGNOSIS — O0990 Supervision of high risk pregnancy, unspecified, unspecified trimester: Secondary | ICD-10-CM | POA: Insufficient documentation

## 2021-03-10 ENCOUNTER — Ambulatory Visit (INDEPENDENT_AMBULATORY_CARE_PROVIDER_SITE_OTHER): Payer: Self-pay | Admitting: Obstetrics and Gynecology

## 2021-03-10 ENCOUNTER — Other Ambulatory Visit: Payer: Self-pay

## 2021-03-10 VITALS — BP 103/65 | HR 69 | Wt 171.9 lb

## 2021-03-10 DIAGNOSIS — Z87898 Personal history of other specified conditions: Secondary | ICD-10-CM

## 2021-03-10 DIAGNOSIS — Z8759 Personal history of other complications of pregnancy, childbirth and the puerperium: Secondary | ICD-10-CM

## 2021-03-10 DIAGNOSIS — Z789 Other specified health status: Secondary | ICD-10-CM

## 2021-03-10 DIAGNOSIS — O0990 Supervision of high risk pregnancy, unspecified, unspecified trimester: Secondary | ICD-10-CM

## 2021-03-10 DIAGNOSIS — N898 Other specified noninflammatory disorders of vagina: Secondary | ICD-10-CM

## 2021-03-10 DIAGNOSIS — Z3A30 30 weeks gestation of pregnancy: Secondary | ICD-10-CM | POA: Insufficient documentation

## 2021-03-10 DIAGNOSIS — Z98891 History of uterine scar from previous surgery: Secondary | ICD-10-CM

## 2021-03-10 DIAGNOSIS — Z758 Other problems related to medical facilities and other health care: Secondary | ICD-10-CM

## 2021-03-10 NOTE — Progress Notes (Signed)
   PRENATAL VISIT NOTE  Subjective:  Melanie Watts is a 25 y.o. G2P1001 at [redacted]w[redacted]d being seen today for ongoing prenatal care.  She is currently monitored for the following issues for this high-risk pregnancy and has Supervision of high risk pregnancy, antepartum; History of low birth weight; History of pre-eclampsia; History of C-section; Language barrier; and [redacted] weeks gestation of pregnancy on their problem list.  Patient doing well with no acute concerns today. She reports no complaints.  Contractions: Irritability. Vag. Bleeding: None.  Movement: Present. Denies leaking of fluid.   The following portions of the patient's history were reviewed and updated as appropriate: allergies, current medications, past family history, past medical history, past social history, past surgical history and problem list. Problem list updated.  Objective:   Vitals:   03/10/21 0924  BP: 103/65  Pulse: 69  Weight: 171 lb 14.4 oz (78 kg)    Fetal Status: Fetal Heart Rate (bpm): 168 Fundal Height: 29 cm Movement: Present     General:  Alert, oriented and cooperative. Patient is in no acute distress.  Skin: Skin is warm and dry. No rash noted.   Cardiovascular: Normal heart rate noted  Respiratory: Normal respiratory effort, no problems with respiration noted  Abdomen: Soft, gravid, appropriate for gestational age.  Pain/Pressure: Present     Pelvic: Cervical exam deferred        Extremities: Normal range of motion.  Edema: Trace  Mental Status:  Normal mood and affect. Normal behavior. Normal judgment and thought content.   Assessment and Plan:  Pregnancy: G2P1001 at [redacted]w[redacted]d  1. Vaginal discharge Pt has no itch or odor, pt reassured this is likely physiologic  2. Supervision of high risk pregnancy, antepartum Continue routine care  3. History of pre-eclampsia No s/sx of preeclampsia  4. History of low birth weight Growth scan scheduled 03/25/21  5. Language barrier Interpreter present  VBAC  consent signed today.  Pt previously consulted with Dr. Vergie Living.  She is aware of risk of uterine rupture and possible need for emergency c section.  Preterm labor symptoms and general obstetric precautions including but not limited to vaginal bleeding, contractions, leaking of fluid and fetal movement were reviewed in detail with the patient.  Please refer to After Visit Summary for other counseling recommendations.   Return in about 2 weeks (around 03/24/2021) for [facility], in person.   Mariel Aloe, MD Faculty Attending Center for [facility]

## 2021-03-18 ENCOUNTER — Encounter: Payer: Self-pay | Admitting: *Deleted

## 2021-03-24 ENCOUNTER — Ambulatory Visit (INDEPENDENT_AMBULATORY_CARE_PROVIDER_SITE_OTHER): Payer: Self-pay | Admitting: Family Medicine

## 2021-03-24 ENCOUNTER — Other Ambulatory Visit: Payer: Self-pay

## 2021-03-24 ENCOUNTER — Encounter: Payer: Self-pay | Admitting: Family Medicine

## 2021-03-24 DIAGNOSIS — Z789 Other specified health status: Secondary | ICD-10-CM

## 2021-03-24 DIAGNOSIS — Z8759 Personal history of other complications of pregnancy, childbirth and the puerperium: Secondary | ICD-10-CM

## 2021-03-24 DIAGNOSIS — O0990 Supervision of high risk pregnancy, unspecified, unspecified trimester: Secondary | ICD-10-CM

## 2021-03-24 DIAGNOSIS — Z98891 History of uterine scar from previous surgery: Secondary | ICD-10-CM

## 2021-03-24 NOTE — Patient Instructions (Signed)
Eleccin del mtodo anticonceptivo Contraception Choices La anticoncepcin, o los mtodos anticonceptivos, hace referencia a los mtodos o dispositivos que evitan el embarazo. Mtodos hormonales Implante anticonceptivo Un implante anticonceptivo consiste en un tubo delgado de plstico que contiene una hormona que evita el embarazo. Es diferente de un dispositivo intrauterino (DIU). Un mdico lo inserta en la parte superior del brazo. Los implantes pueden ser eficaces durante un mximo de 3 aos. Inyecciones de progestina sola Las inyecciones de progestina sola contienen progestina, una forma sinttica de la hormona progesterona. Un mdico las administra cada 3 meses. Pldoras anticonceptivas Las pldoras anticonceptivas son pastillas que contienen hormonas que evitan el embarazo. Deben tomarse una vez al da, preferentemente a la misma hora cada da. Se necesita una receta para utilizar este mtodo anticonceptivo. Parche anticonceptivo El parche anticonceptivo contiene hormonas que evitan el embarazo. Se coloca en la piel, debe cambiarse una vez a la semana durante tres semanas y debe retirarse en la cuarta semana. Se necesita una receta para utilizar este mtodo anticonceptivo. Anillo vaginal Un anillo vaginal contiene hormonas que evitan el embarazo. Se coloca en la vagina durante tres semanas y se retira en la cuarta semana. Luego se repite el proceso con un anillo nuevo. Se necesita una receta para utilizar este mtodo anticonceptivo. Anticonceptivo de emergencia Los anticonceptivos de emergencia son mtodos para evitar un embarazo despus de tener sexo sin proteccin. Vienen en forma de pldora y pueden tomarse hasta 5 das despus de tener sexo. Funcionan mejor cuando se toman lo ms pronto posible luego de tener sexo. La mayora de los anticonceptivos de emergencia estn disponibles sin receta mdica. Este mtodo no debe utilizarse como el nico mtodo anticonceptivo. Mtodos de  barrera Condn masculino Un condn masculino es una vaina delgada que se coloca sobre el pene durante el sexo. Los condones evitan que el esperma ingrese en el cuerpo de la mujer. Pueden utilizarse con un una sustancia que mata a los espermatozoides (espermicida) para aumentar la efectividad. Deben desecharse despus de un uso. Condn femenino Un condn femenino es una vaina blanda y holgada que se coloca en la vagina antes de tener sexo. El condn evita que el esperma ingrese en el cuerpo de la mujer. Deben desecharse despus de un uso. Diafragma Un diafragma es una barrera blanda con forma de cpula. Se inserta en la vagina antes del sexo, junto con un espermicida. El diafragma bloquea el ingreso de esperma en el tero, y el espermicida mata a los espermatozoides. El diafragma debe permanecer en la vagina durante 6 a 8 horas despus de tener sexo y debe retirarse en el plazo de las 24 horas. Un diafragma es recetado y colocado por un mdico. Debe reemplazarse cada 1 a 2 aos, despus de dar a luz, de aumentar ms de 15lb (6.8kg) y de una ciruga plvica. Capuchn cervical Un capuchn cervical es una copa redonda y blanda de ltex o plstico que se coloca en el cuello uterino. Se inserta en la vagina antes del sexo, junto con un espermicida. Bloquea el ingreso del esperma en el tero. El capuchn debe permanecer en el lugar durante 6 a 8 horas despus de tener sexo y debe retirarse en el plazo de las 48 horas. Un capuchn cervical debe ser recetado y colocado por un mdico. Debe reemplazarse cada 2aos. Esponja Una esponja es una pieza blanda y circular de espuma de poliuretano que contiene espermicida. La esponja ayuda a bloquear el ingreso de esperma en el tero, y el espermicida mata a   los espermatozoides. Para utilizarla, debe humedecerla e insertarla en la vagina. Debe insertarse antes de tener sexo, debe permanecer dentro al menos durante 6 horas despus de tener sexo y debe retirarse y  desecharse en el plazo de las 30 horas. Espermicidas Los espermicidas son sustancias qumicas que matan o bloquean al esperma y no lo dejan ingresar al cuello uterino y al tero. Vienen en forma de crema, gel, supositorio, espuma o comprimido. Un espermicida debe insertarse en la vagina con un aplicador al menos 10 o 15 minutos antes de tener sexo para dar tiempo a que surta efecto. El proceso debe repetirse cada vez que tenga sexo. Los espermicidas no requieren receta mdica. Anticonceptivos intrauterinos Dispositivo intrauterino (DIU) Un DIU es un dispositivo en forma de T que se coloca en el tero. Existen dos tipos: DIU hormonal.Este tipo contiene progestina, una forma sinttica de la hormona progesterona. Este tipo puede permanecer colocado durante 3 a 5 aos. DIU de cobre.Este tipo est recubierto con un alambre de cobre. Puede permanecer colocado durante 10 aos. Mtodos anticonceptivos permanentes Ligadura de trompas en la mujer En este mtodo, se sellan, atan u obstruyen las trompas de Falopio durante una ciruga para evitar que el vulo descienda hacia el tero. Esterilizacin histeroscpica En este mtodo, se coloca un implante pequeo y flexible dentro de cada trompa de Falopio. Los implantes hacen que se forme un tejido cicatricial en las trompas de Falopio y que las obstruya para que el espermatozoide no pueda llegar al vulo. El procedimiento demora alrededor de 3 meses para que sea efectivo. Debe utilizarse otro mtodo anticonceptivo durante esos 3 meses. Esterilizacin masculina Este es un procedimiento que consiste en atar los conductos que transportan el esperma (vasectoma). Luego del procedimiento, el hombre puede eyacular lquido (semen). Debe utilizarse otro mtodo anticonceptivo durante 3 meses despus del procedimiento. Mtodos de planificacin natural Planificacin familiar natural En este mtodo, la pareja no tiene sexo durante los das en que la mujer podra quedar  embarazada. Mtodo calendario En este mtodo, la mujer realiza un seguimiento de la duracin de cada ciclo menstrual, identifica los das en los que se puede producir un embarazo y no tiene sexo durante esos das. Mtodo de la ovulacin En este mtodo, la pareja evita tener sexo durante la ovulacin. Mtodo sintotrmico Este mtodo implica no tener sexo durante la ovulacin. Normalmente, la mujer comprueba la ovulacin al observar cambios en su temperatura y en la consistencia del moco cervical. Mtodo posovulacin En este mtodo, la pareja espera a que finalice la ovulacin para tener sexo. Dnde buscar ms informacin Centers for Disease Control and Prevention (Centros para el Control y la Prevencin de Enfermedades): www.cdc.gov Resumen La anticoncepcin, o los mtodos anticonceptivos, hace referencia a los mtodos o dispositivos que evitan el embarazo. Los mtodos anticonceptivos hormonales incluyen implantes, inyecciones, pastillas, parches, anillos vaginales y anticonceptivos de emergencia. Los mtodos anticonceptivos de barrera pueden incluir condones masculinos, condones femeninos, diafragmas, capuchones cervicales, esponjas y espermicidas. Existen dos tipos de DIU (dispositivo intrauterino). Un DIU puede colocarse en el tero de una mujer para evitar el embarazo durante 3 a 5 aos. La esterilizacin permanente puede realizarse mediante un procedimiento tanto en los hombres como en las mujeres. Los mtodos de planificacin familiar natural implican no tener sexo durante los das en que la mujer podra quedar embarazada. Esta informacin no tiene como fin reemplazar el consejo del mdico. Asegrese de hacerle al mdico cualquier pregunta que tenga. Document Revised: 04/07/2020 Document Reviewed: 04/07/2020 Elsevier Patient Education  2022 Elsevier   Inc.  

## 2021-03-24 NOTE — Progress Notes (Signed)
   Subjective:  Melanie Watts is a 25 y.o. G2P1001 at [redacted]w[redacted]d being seen today for ongoing prenatal care.  She is currently monitored for the following issues for this high-risk pregnancy and has Supervision of high risk pregnancy, antepartum; History of low birth weight; History of pre-eclampsia; History of C-section; Language barrier; and [redacted] weeks gestation of pregnancy on their problem list.  Patient reports no complaints.  Contractions: Not present. Vag. Bleeding: None.  Movement: Present. Denies leaking of fluid.   The following portions of the patient's history were reviewed and updated as appropriate: allergies, current medications, past family history, past medical history, past social history, past surgical history and problem list. Problem list updated.  Objective:   Vitals:   03/24/21 0926  BP: 107/74  Pulse: 81  Weight: 175 lb 6.4 oz (79.6 kg)    Fetal Status: Fetal Heart Rate (bpm): 147   Movement: Present     General:  Alert, oriented and cooperative. Patient is in no acute distress.  Skin: Skin is warm and dry. No rash noted.   Cardiovascular: Normal heart rate noted  Respiratory: Normal respiratory effort, no problems with respiration noted  Abdomen: Soft, gravid, appropriate for gestational age. Pain/Pressure: Absent     Pelvic: Vag. Bleeding: None     Cervical exam deferred        Extremities: Normal range of motion.  Edema: Trace  Mental Status: Normal mood and affect. Normal behavior. Normal judgment and thought content.   Urinalysis:      Assessment and Plan:  Pregnancy: G2P1001 at [redacted]w[redacted]d  1. Supervision of high risk pregnancy, antepartum BP and FHR normal Discussed contraception, would like Nexplanon  2. History of C-section Desires TOLAC, consent signed last visit  3. History of pre-eclampsia On ASA, BP normal  4. Language barrier Spanish  Preterm labor symptoms and general obstetric precautions including but not limited to vaginal bleeding,  contractions, leaking of fluid and fetal movement were reviewed in detail with the patient. Please refer to After Visit Summary for other counseling recommendations.  Return in 2 weeks (on 04/07/2021) for [facility], ob visit.   Venora Maples, MD

## 2021-03-25 ENCOUNTER — Encounter: Payer: Self-pay | Admitting: *Deleted

## 2021-03-25 ENCOUNTER — Ambulatory Visit: Payer: Self-pay | Admitting: *Deleted

## 2021-03-25 ENCOUNTER — Other Ambulatory Visit: Payer: Self-pay | Admitting: Maternal & Fetal Medicine

## 2021-03-25 ENCOUNTER — Ambulatory Visit: Payer: Self-pay | Attending: Maternal & Fetal Medicine

## 2021-03-25 VITALS — BP 116/70 | HR 72

## 2021-03-25 DIAGNOSIS — Z87898 Personal history of other specified conditions: Secondary | ICD-10-CM

## 2021-03-25 DIAGNOSIS — Z3A32 32 weeks gestation of pregnancy: Secondary | ICD-10-CM

## 2021-03-25 DIAGNOSIS — Z362 Encounter for other antenatal screening follow-up: Secondary | ICD-10-CM

## 2021-03-25 DIAGNOSIS — O0990 Supervision of high risk pregnancy, unspecified, unspecified trimester: Secondary | ICD-10-CM

## 2021-03-25 DIAGNOSIS — O09293 Supervision of pregnancy with other poor reproductive or obstetric history, third trimester: Secondary | ICD-10-CM

## 2021-03-25 DIAGNOSIS — Z98891 History of uterine scar from previous surgery: Secondary | ICD-10-CM

## 2021-03-25 DIAGNOSIS — Z8759 Personal history of other complications of pregnancy, childbirth and the puerperium: Secondary | ICD-10-CM

## 2021-03-25 IMAGING — US US MFM OB FOLLOW-UP
1 series · 13 of 28 positions shown · non-contrast
Comparison: none

[Series 1: us mfm ob follow-up · 13 of 71 slices shown]
[im 3/71]
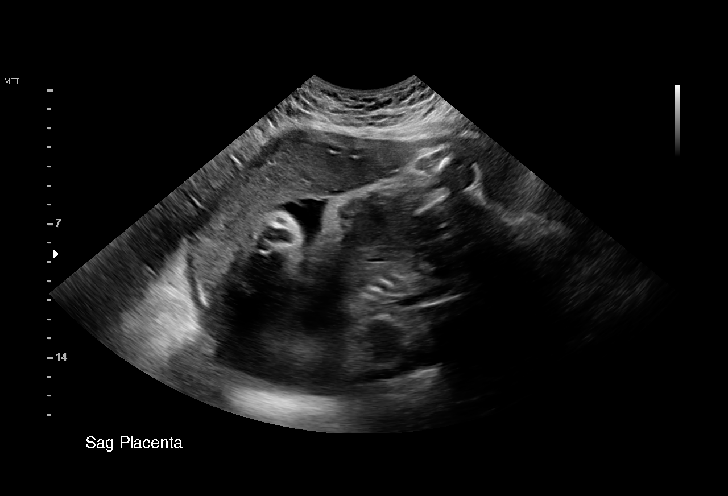
[im 8/71]
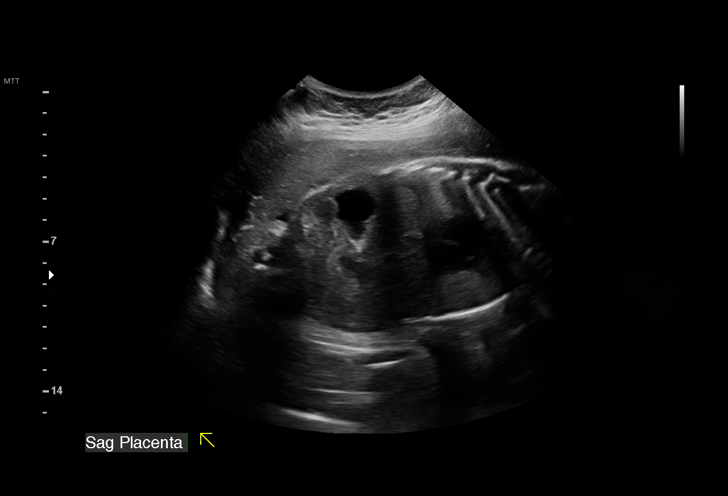
[im 13/71]
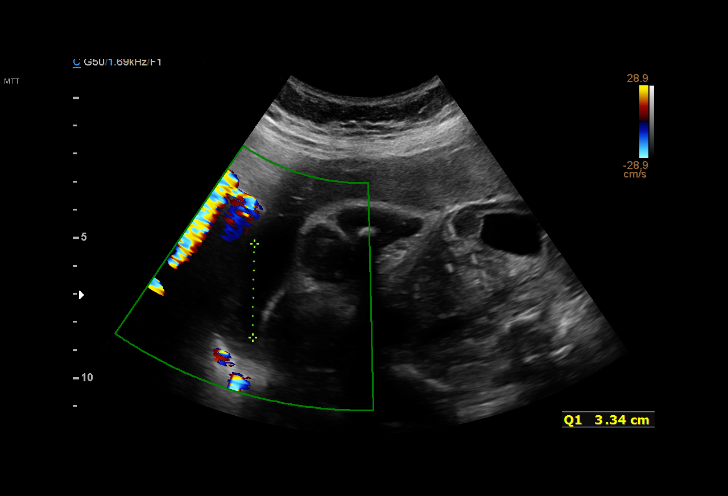
[im 19/71]
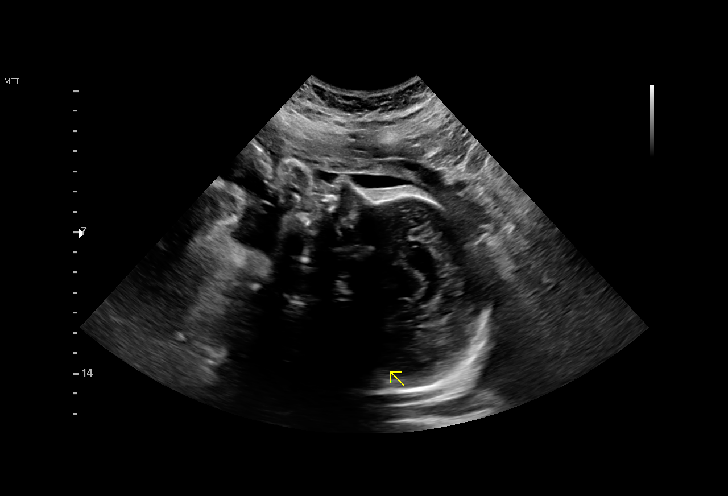
[im 24/71]
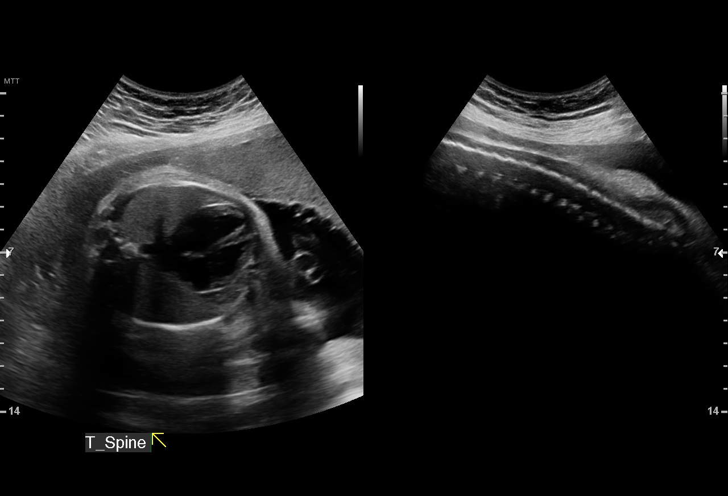
[im 29/71]
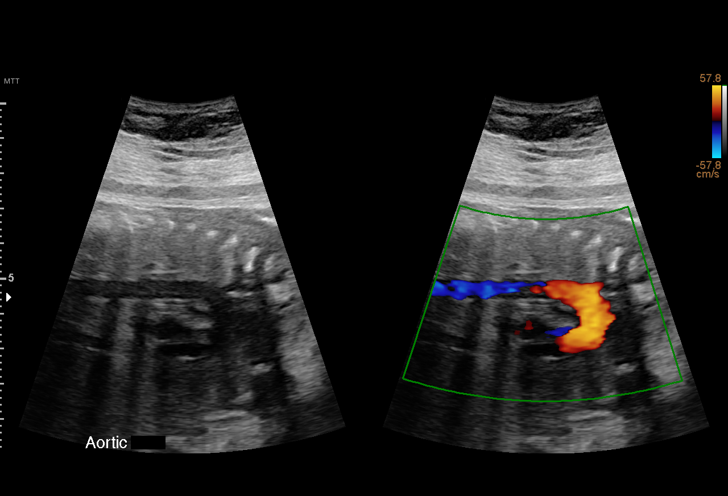
[im 37/71]
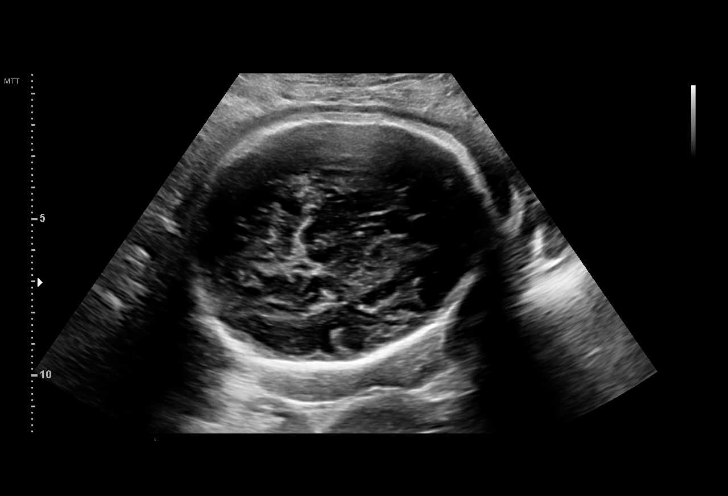
[im 42/71]
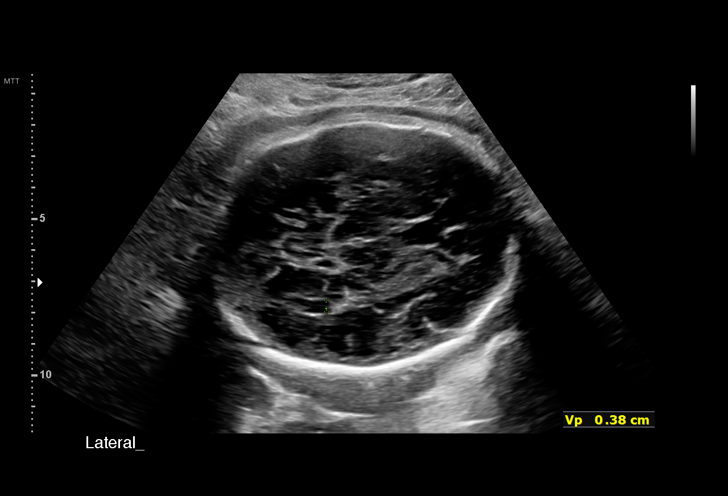
[im 47/71]
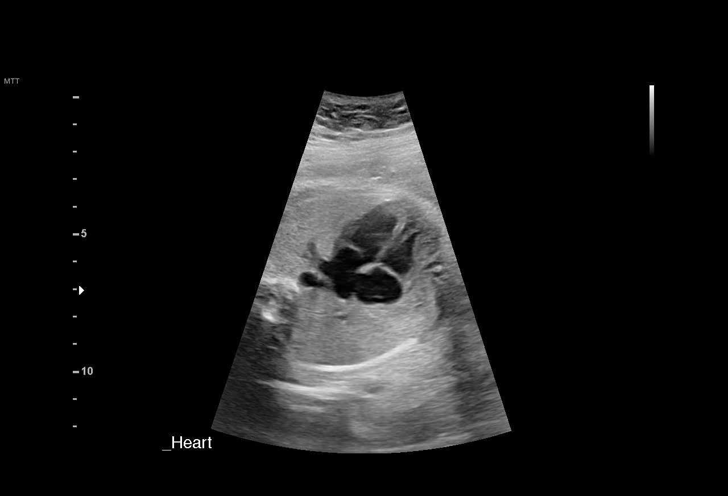
[im 52/71]
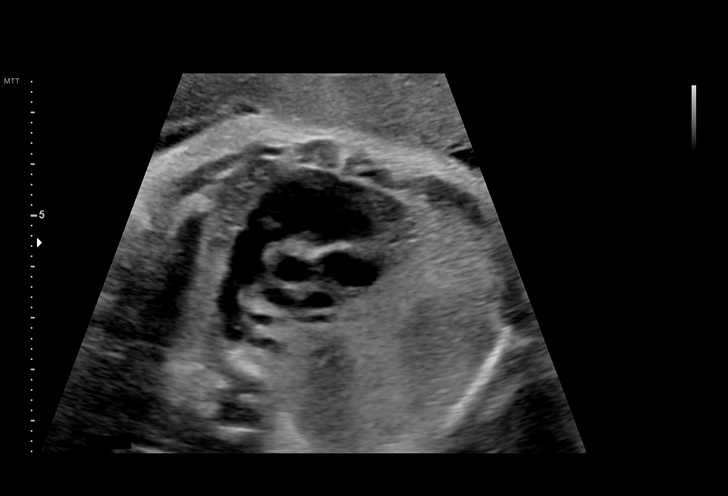
[im 58/71]
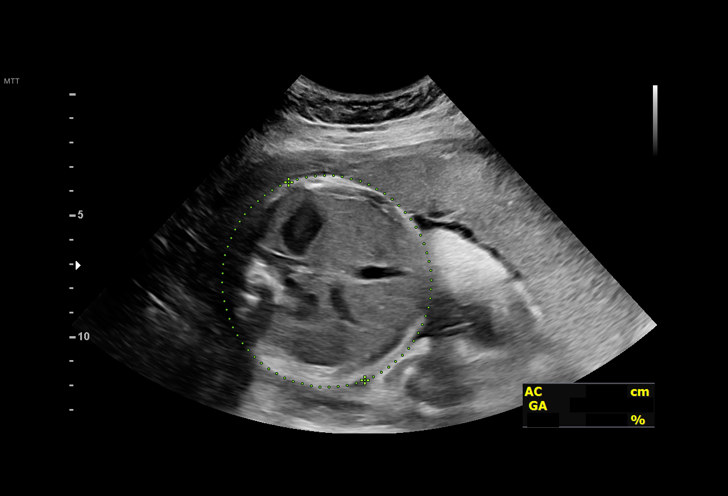
[im 63/71]
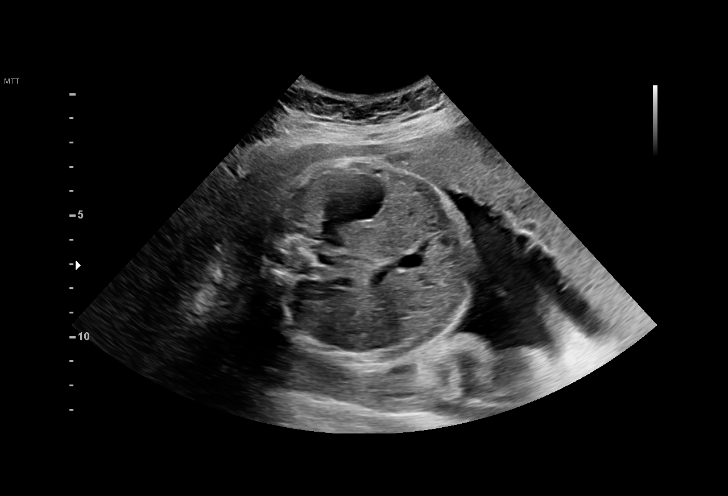
[im 68/71]
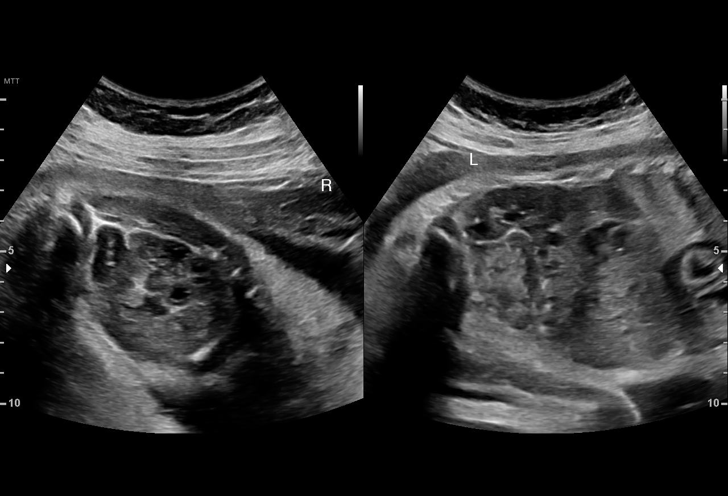

[13 of 28 positions shown; findings below may reference images not displayed]

Attending:        BUDI HUMAN        Secondary Phy.:   [REDACTED]
                   [FX]

                                                      BUDI HUMAN

Indications

 32 weeks gestation of pregnancy
 History of cesarean delivery, currently        [FX]
 pregnant
 Poor obstetric history: Previous               [FX]
 preeclampsia / eclampsia/gestational HTN
 Encounter for other antenatal screening        [FX]
 follow-up
Fetal Evaluation

 Num Of Fetuses:         1
 Fetal Heart Rate(bpm):  144
 Cardiac Activity:       Observed
 Presentation:           Cephalic
 Placenta:               Anterior
 P. Cord Insertion:      Previously Visualized

 Amniotic Fluid
 AFI FV:      Within normal limits

 AFI Sum(cm)     %Tile       Largest Pocket(cm)
 17              62          6

 RUQ(cm)       RLQ(cm)       LUQ(cm)        LLQ(cm)
 3.3           3.9           3.8            6
Biometry

 BPD:      79.2  mm     G. Age:  31w 6d         18  %    CI:        78.62   %    70 - 86
                                                         FL/HC:      21.3   %    19.9 -
 HC:      282.5  mm     G. Age:  31w 0d        < 1  %    HC/AC:      1.02        0.96 -
 AC:      275.8  mm     G. Age:  31w 5d         20  %    FL/BPD:     76.0   %    71 - 87
 FL:       60.2  mm     G. Age:  31w 2d          9  %    FL/AC:      21.8   %    20 - 24
 CER:      41.9  mm     G. Age:  33w 2d         43  %

 LV:        3.8  mm
 CM:        5.9  mm

 Est. FW:    [FX]  gm    3 lb 15 oz      11  %
OB History

 Gravidity:    2         Term:   1
 Living:       1
Gestational Age

 LMP:           32w 5d        Date:  [DATE]                 EDD:   [DATE]
 U/S Today:     31w 3d                                        EDD:   [DATE]
 Best:          32w 5d     Det. By:  LMP  ([DATE])          EDD:   [DATE]
Anatomy

 Cranium:               Appears normal         LVOT:                   Appears normal
 Cavum:                 Appears normal         Aortic Arch:            Appears normal
 Ventricles:            Appears normal         Ductal Arch:            Previously seen
 Choroid Plexus:        Appears normal         Diaphragm:              Appears normal
 Cerebellum:            Appears normal         Stomach:                Appears normal, left
                                                                       sided
 Posterior Fossa:       Appears normal         Abdomen:                Appears normal
 Nuchal Fold:           Not applicable (>20    Abdominal Wall:         Previously seen
                        wks GA)
 Face:                  Profile nl; orbits     Cord Vessels:           Previously seen
                        prev. visualized
 Lips:                  Appears normal         Kidneys:                Appear normal
 Palate:                Not well visualized    Bladder:                Appears normal
 Thoracic:              Appears normal         Spine:                  Appears normal
 Heart:                 Appears normal         Upper Extremities:      Appears normal
                        (4CH, axis, and
                        situs)
 RVOT:                  Appears normal         Lower Extremities:      Previously seen

 Other:  Female gender previously seen. Heels previously seen. 3VV and
         3VTV visualized. Technically difficult due to advanced GA and fetal
         position.
Cervix Uterus Adnexa

 Cervix
 Not visualized (advanced GA >[FX])

 Uterus
 No abnormality visualized.
 Right Ovary
 Within normal limits.

 Left Ovary
 Within normal limits.

 Cul De Sac
 No free fluid seen.

 Adnexa
 No abnormality visualized.
Impression

 Patient returned for completion of fetal anatomy .Amniotic
 fluid is normal and good fetal activity is seen .Fetal biometry
 is consistent with her previously-established dates .Fetal
 anatomical survey was completed and appears normal.
 She does not have gestational diabetes .
Recommendations

 -An appointment was made for her to return in 4 weeks for
 fetal growth assessment (history of preeclampsia).
                 BUDI HUMAN

## 2021-03-26 ENCOUNTER — Other Ambulatory Visit: Payer: Self-pay | Admitting: Obstetrics and Gynecology

## 2021-03-26 DIAGNOSIS — Z3A36 36 weeks gestation of pregnancy: Secondary | ICD-10-CM

## 2021-03-26 DIAGNOSIS — O09293 Supervision of pregnancy with other poor reproductive or obstetric history, third trimester: Secondary | ICD-10-CM

## 2021-03-31 ENCOUNTER — Inpatient Hospital Stay (HOSPITAL_COMMUNITY)
Admission: AD | Admit: 2021-03-31 | Discharge: 2021-03-31 | Disposition: A | Discharge status: Home / Self Care | Payer: Self-pay | Attending: Obstetrics & Gynecology | Admitting: Obstetrics & Gynecology

## 2021-03-31 ENCOUNTER — Encounter (HOSPITAL_COMMUNITY): Payer: Self-pay | Admitting: Obstetrics & Gynecology

## 2021-03-31 ENCOUNTER — Other Ambulatory Visit: Payer: Self-pay

## 2021-03-31 ENCOUNTER — Telehealth: Payer: Self-pay

## 2021-03-31 DIAGNOSIS — O368130 Decreased fetal movements, third trimester, not applicable or unspecified: Secondary | ICD-10-CM | POA: Insufficient documentation

## 2021-03-31 DIAGNOSIS — O4703 False labor before 37 completed weeks of gestation, third trimester: Secondary | ICD-10-CM

## 2021-03-31 DIAGNOSIS — O4700 False labor before 37 completed weeks of gestation, unspecified trimester: Secondary | ICD-10-CM

## 2021-03-31 DIAGNOSIS — Z7982 Long term (current) use of aspirin: Secondary | ICD-10-CM | POA: Insufficient documentation

## 2021-03-31 DIAGNOSIS — Z98891 History of uterine scar from previous surgery: Secondary | ICD-10-CM

## 2021-03-31 DIAGNOSIS — O0990 Supervision of high risk pregnancy, unspecified, unspecified trimester: Secondary | ICD-10-CM

## 2021-03-31 DIAGNOSIS — Z8759 Personal history of other complications of pregnancy, childbirth and the puerperium: Secondary | ICD-10-CM

## 2021-03-31 DIAGNOSIS — Z3A33 33 weeks gestation of pregnancy: Secondary | ICD-10-CM | POA: Insufficient documentation

## 2021-03-31 DIAGNOSIS — Z87898 Personal history of other specified conditions: Secondary | ICD-10-CM

## 2021-03-31 LAB — URINALYSIS, ROUTINE W REFLEX MICROSCOPIC
Bilirubin Urine: NEGATIVE
Glucose, UA: NEGATIVE mg/dL
Hgb urine dipstick: NEGATIVE
Ketones, ur: NEGATIVE mg/dL
Leukocytes,Ua: NEGATIVE
Nitrite: NEGATIVE
Protein, ur: NEGATIVE mg/dL
Specific Gravity, Urine: 1.008 (ref 1.005–1.030)
pH: 6 (ref 5.0–8.0)

## 2021-03-31 LAB — WET PREP, GENITAL
Clue Cells Wet Prep HPF POC: NONE SEEN
Sperm: NONE SEEN
Trich, Wet Prep: NONE SEEN
Yeast Wet Prep HPF POC: NONE SEEN

## 2021-03-31 NOTE — MAU Provider Note (Signed)
Chief Complaint:  Contractions    HPI: Melanie Watts is a 25 y.o. G2P1001 at [redacted]w[redacted]d who presents to maternity admissions reporting contractions and decreased fetal movement. Patient reports this morning her stomach started to "feel hard". She thinks she is having contractions. They are not painful, but are coming about every 5-10 mins. She denies leaking fluid or vaginal bleeding. She did have intercourse yesterday and reports she has not had much to drink today. Reports fetal movement has been a little decreased today, but since her arrival to MAU and being placed on monitors, she is feeling a lot. Otherwise, denies urinary s/s, n/v, diarrhea, or fever.  History is significant for previous c/s, desires TOLAC.   Pregnancy Course:   Past Medical History:  Diagnosis Date   Preeclampsia 2015   At 30 weeks   SGA (small for gestational age) 46   Pts first child   OB History  Gravida Para Term Preterm AB Living  2 1 1     1   SAB IAB Ectopic Multiple Live Births          1    # Outcome Date GA Lbr Len/2nd Weight Sex Delivery Anes PTL Lv  2 Current           1 Term 07/04/14 [redacted]w[redacted]d  2300 g M CS-LTranv EPI N LIV     Complications: Nuchal cord affecting delivery   Past Surgical History:  Procedure Laterality Date   APPENDECTOMY     Surgery was performed in M in 2020   CESAREAN SECTION     History reviewed. No pertinent family history. Social History   Tobacco Use   Smoking status: Never   Smokeless tobacco: Never  Vaping Use   Vaping Use: Never used  Substance Use Topics   Alcohol use: Never   Drug use: Never   Allergies  Allergen Reactions   Salmon Oil [Nutritional Supplements]    Medications Prior to Admission  Medication Sig Dispense Refill Last Dose   aspirin EC 81 MG tablet Take 1 tablet (81 mg total) by mouth daily. Take after 12 weeks for prevention of preeclampsia later in pregnancy 300 tablet 2    ondansetron (ZOFRAN ODT) 4 MG disintegrating tablet Take 1  tablet (4 mg total) by mouth every 6 (six) hours as needed for nausea. (Patient not taking: No sig reported) 20 tablet 0    Prenatal MV-Min-Fe Fum-FA-DHA ([employer] PRENATAL MULTI+DHA) 27-0.8-250 MG CAPS Take 1 capsule by mouth daily. 60 capsule 1    I have reviewed patient's Past Medical Hx, Surgical Hx, Family Hx, Social Hx, medications and allergies.   ROS:  Review of Systems  Constitutional: Negative.   Respiratory: Negative.    Cardiovascular: Negative.   Gastrointestinal:  Negative for diarrhea, nausea and vomiting.       Abdominal "tightening"   Genitourinary: Negative.   Musculoskeletal: Negative.   Neurological: Negative.   Psychiatric/Behavioral: Negative.     Physical Exam  Patient Vitals for the past 24 hrs:  BP Temp Temp src Pulse Resp SpO2 Weight  03/31/21 1652 117/73 -- -- (!) 59 -- -- --  03/31/21 1646 113/68 -- -- (!) 56 -- -- --  03/31/21 1530 113/65 97.9 F (36.6 C) Oral 64 16 99 % 82 kg   Constitutional: well-developed, well-nourished female in no acute distress.  Cardiovascular: normal rate Respiratory: normal effort GI: abd soft, non-tender, gravid, no guarding MS: extremities nontender, no edema, normal ROM Neurologic: alert and oriented x 4.  GU: neg  CVAT. Pelvic: deferred, blind swabs obtained Cervix: closed/thick/posterior  FHT: Baseline 135 bpm, moderate variability, 15x15 accelerations present, no decelerations Contractions: q 5-8 mins   Labs: Results for orders placed or performed during the hospital encounter of 03/31/21 (from the past 24 hour(s))  Urinalysis, Routine w reflex microscopic Urine, Clean Catch     Status: Abnormal   Collection Time: 03/31/21  3:32 PM  Result Value Ref Range   Color, Urine STRAW (A) YELLOW   APPearance CLEAR CLEAR   Specific Gravity, Urine 1.008 1.005 - 1.030   pH 6.0 5.0 - 8.0   Glucose, UA NEGATIVE NEGATIVE mg/dL   Hgb urine dipstick NEGATIVE NEGATIVE   Bilirubin Urine NEGATIVE NEGATIVE   Ketones, ur NEGATIVE  NEGATIVE mg/dL   Protein, ur NEGATIVE NEGATIVE mg/dL   Nitrite NEGATIVE NEGATIVE   Leukocytes,Ua NEGATIVE NEGATIVE  Wet prep, genital     Status: Abnormal   Collection Time: 03/31/21  3:50 PM   Specimen: Vaginal  Result Value Ref Range   Yeast Wet Prep HPF POC NONE SEEN NONE SEEN   Trich, Wet Prep NONE SEEN NONE SEEN   Clue Cells Wet Prep HPF POC NONE SEEN NONE SEEN   WBC, Wet Prep HPF POC MODERATE (A) NONE SEEN   Sperm NONE SEEN     Imaging:    MAU Course: Orders Placed This Encounter  Procedures   Wet prep, genital   Urinalysis, Routine w reflex microscopic Urine, Clean Catch   Discharge patient   No orders of the defined types were placed in this encounter.   MDM: UA unremarkable Wet prep, GC/CT collected; FFN not collected given recent IC Cervix closed/thick, remains closed after 1 hour NST reactive and reassuring for gestational age Toco q 5-8 mins PO hydration NST "clicker" given to patient with multiple tick marks noted. Patient reports she continues to feel vigorous fetal movement Contractions remain painless throughout stay Spanish interpreter used during entire visit   Assessment: Preterm contractions  Decreased fetal movement [redacted] weeks gestation of pregnancy   Plan: Discharge home in stable condition  Preterm labor precautions and fetal kick counts reviewed Keep OB appointment as scheduled on 04/07/2021    Follow-up Information     Center for [facility] Follow up.   Specialty: Obstetrics and Gynecology Why: as scheduled on 04/07/2021. Return to MAU as needed. Contact information: [address] [place] [address] [phone]                Allergies as of 03/31/2021       Reactions   Salmon Oil [nutritional Supplements]         Medication List     STOP taking these medications    ondansetron 4 MG disintegrating tablet Commonly known as: Zofran ODT       TAKE  these medications    aspirin EC 81 MG tablet Take 1 tablet (81 mg total) by mouth daily. Take after 12 weeks for prevention of preeclampsia later in pregnancy   CVS Prenatal Multi+DHA 27-0.8-250 MG Caps Take 1 capsule by mouth daily.         Camelia Eng, MSN, CNM 03/31/2021 4:59 PM

## 2021-03-31 NOTE — Telephone Encounter (Signed)
Patient called nurse phone and spoke with Emmie Niemann. Called pt back with Spanish interpreter Eda; pt has VM not set up. Called a second time with no answer.   Pt returned phone call to prenatal navigator Renea Ee. I requested that Renea Ee ask the patient to please answer my phone call.   Called pt with interpreter Eda. Pt reports contractions every 5-10 minutes since earlier this morning. Denies vaginal bleeding or leaking of fluid. Reports fetal movement has decreased today. Advised pt to go to MAU for evaluation. Pt agreeable to plan. States she will call [facility] transportation as soon as we end phone call.

## 2021-03-31 NOTE — MAU Note (Signed)
Melanie Watts is a 25 y.o. at [redacted]w[redacted]d here in MAU reporting: contractions started this AM and they are less than 10 minutes. Pt reports they are not painful. She is not having any bleeding or LOF. Is having some pressure. DFM.  Onset of complaint: today  Pain score: 0/10  Vitals:   03/31/21 1530  BP: 113/65  Pulse: 64  Resp: 16  Temp: 97.9 F (36.6 C)  SpO2: 99%     FHT: EFM applied in room  Lab orders placed from triage: UA

## 2021-04-01 LAB — GC/CHLAMYDIA PROBE AMP (~~LOC~~) NOT AT ARMC
Chlamydia: NEGATIVE
Comment: NEGATIVE
Comment: NORMAL
Neisseria Gonorrhea: NEGATIVE

## 2021-04-07 ENCOUNTER — Ambulatory Visit (INDEPENDENT_AMBULATORY_CARE_PROVIDER_SITE_OTHER): Payer: Self-pay | Admitting: Family Medicine

## 2021-04-07 ENCOUNTER — Other Ambulatory Visit: Payer: Self-pay

## 2021-04-07 VITALS — BP 113/70 | HR 67 | Wt 179.7 lb

## 2021-04-07 DIAGNOSIS — Z8759 Personal history of other complications of pregnancy, childbirth and the puerperium: Secondary | ICD-10-CM

## 2021-04-07 DIAGNOSIS — Z3A34 34 weeks gestation of pregnancy: Secondary | ICD-10-CM

## 2021-04-07 DIAGNOSIS — Z789 Other specified health status: Secondary | ICD-10-CM

## 2021-04-07 DIAGNOSIS — O0990 Supervision of high risk pregnancy, unspecified, unspecified trimester: Secondary | ICD-10-CM

## 2021-04-07 DIAGNOSIS — Z758 Other problems related to medical facilities and other health care: Secondary | ICD-10-CM

## 2021-04-07 DIAGNOSIS — Z98891 History of uterine scar from previous surgery: Secondary | ICD-10-CM

## 2021-04-07 NOTE — Progress Notes (Signed)
   PRENATAL VISIT NOTE  Subjective:  Melanie Watts is a 25 y.o. G2P1001 at [redacted]w[redacted]d being seen today for ongoing prenatal care.  She is currently monitored for the following issues for this high-risk pregnancy and has Supervision of high risk pregnancy, antepartum; History of low birth weight; History of pre-eclampsia; History of C-section; Language barrier; and [redacted] weeks gestation of pregnancy on their problem list.  Patient reports no complaints.  Contractions: Not present. Vag. Bleeding: None.  Movement: Present. Denies leaking of fluid.   The following portions of the patient's history were reviewed and updated as appropriate: allergies, current medications, past family history, past medical history, past social history, past surgical history and problem list.   Objective:   Vitals:   04/07/21 0919  BP: 113/70  Pulse: 67  Weight: 179 lb 11.2 oz (81.5 kg)    Fetal Status: Fetal Heart Rate (bpm): 152   Movement: Present     General:  Alert, oriented and cooperative. Patient is in no acute distress.  Skin: Skin is warm and dry. No rash noted.   Cardiovascular: Normal heart rate noted  Respiratory: Normal respiratory effort, no problems with respiration noted  Abdomen: Soft, gravid, appropriate for gestational age.  Pain/Pressure: Absent     Pelvic: Cervical exam deferred        Extremities: Normal range of motion.  Edema: Trace  Mental Status: Normal mood and affect. Normal behavior. Normal judgment and thought content.   Assessment and Plan:  Pregnancy: G2P1001 at [redacted]w[redacted]d 1. Supervision of high risk pregnancy, antepartum FHT and FH normal  2. History of C-section Desires TOLAC  3. Language barrier Interpreter used  4. History of pre-eclampsia ASA 81mg   Preterm labor symptoms and general obstetric precautions including but not limited to vaginal bleeding, contractions, leaking of fluid and fetal movement were reviewed in detail with the patient. Please refer to After Visit  Summary for other counseling recommendations.   No follow-ups on file.  Future Appointments  Date Time Provider Department Center  04/22/2021  3:45 PM [facility] NURSE [facility] [place]  04/22/2021  4:00 PM [facility]  04/23/2021 10:35 AM 06/23/2021, Crissie Reese, MD [facility]    [facility] DO

## 2021-04-22 ENCOUNTER — Encounter: Payer: Self-pay | Admitting: *Deleted

## 2021-04-22 ENCOUNTER — Ambulatory Visit: Payer: Self-pay | Admitting: *Deleted

## 2021-04-22 ENCOUNTER — Other Ambulatory Visit: Payer: Self-pay

## 2021-04-22 ENCOUNTER — Ambulatory Visit: Payer: Self-pay | Attending: Obstetrics and Gynecology

## 2021-04-22 VITALS — BP 121/72 | HR 70

## 2021-04-22 DIAGNOSIS — Z8759 Personal history of other complications of pregnancy, childbirth and the puerperium: Secondary | ICD-10-CM | POA: Insufficient documentation

## 2021-04-22 DIAGNOSIS — Z98891 History of uterine scar from previous surgery: Secondary | ICD-10-CM

## 2021-04-22 DIAGNOSIS — O09293 Supervision of pregnancy with other poor reproductive or obstetric history, third trimester: Secondary | ICD-10-CM | POA: Insufficient documentation

## 2021-04-22 DIAGNOSIS — O0990 Supervision of high risk pregnancy, unspecified, unspecified trimester: Secondary | ICD-10-CM | POA: Diagnosis present

## 2021-04-22 DIAGNOSIS — Z87898 Personal history of other specified conditions: Secondary | ICD-10-CM | POA: Diagnosis present

## 2021-04-22 DIAGNOSIS — Z3A36 36 weeks gestation of pregnancy: Secondary | ICD-10-CM | POA: Insufficient documentation

## 2021-04-23 ENCOUNTER — Ambulatory Visit (INDEPENDENT_AMBULATORY_CARE_PROVIDER_SITE_OTHER): Payer: Self-pay | Admitting: Family Medicine

## 2021-04-23 ENCOUNTER — Encounter: Payer: Self-pay | Admitting: Family Medicine

## 2021-04-23 VITALS — BP 125/75 | HR 68 | Wt 182.7 lb

## 2021-04-23 DIAGNOSIS — Z98891 History of uterine scar from previous surgery: Secondary | ICD-10-CM

## 2021-04-23 DIAGNOSIS — O0990 Supervision of high risk pregnancy, unspecified, unspecified trimester: Secondary | ICD-10-CM

## 2021-04-23 DIAGNOSIS — Z789 Other specified health status: Secondary | ICD-10-CM

## 2021-04-23 DIAGNOSIS — Z8759 Personal history of other complications of pregnancy, childbirth and the puerperium: Secondary | ICD-10-CM

## 2021-04-23 NOTE — Patient Instructions (Signed)
Eleccin del mtodo anticonceptivo Contraception Choices La anticoncepcin, o los mtodos anticonceptivos, hace referencia a los mtodos o dispositivos que evitan el embarazo. Mtodos hormonales Implante anticonceptivo Un implante anticonceptivo consiste en un tubo delgado de plstico que contiene una hormona que evita el embarazo. Es diferente de un dispositivo intrauterino (DIU). Un mdico lo inserta en la parte superior del brazo. Los implantes pueden ser eficaces durante un mximo de 3 aos. Inyecciones de progestina sola Las inyecciones de progestina sola contienen progestina, una forma sinttica de la hormona progesterona. Un mdico las administra cada 3 meses. Pldoras anticonceptivas Las pldoras anticonceptivas son pastillas que contienen hormonas que evitan el embarazo. Deben tomarse una vez al da, preferentemente a la misma hora cada da. Se necesita una receta para utilizar este mtodo anticonceptivo. Parche anticonceptivo El parche anticonceptivo contiene hormonas que evitan el embarazo. Se coloca en la piel, debe cambiarse una vez a la semana durante tres semanas y debe retirarse en la cuarta semana. Se necesita una receta para utilizar este mtodo anticonceptivo. Anillo vaginal Un anillo vaginal contiene hormonas que evitan el embarazo. Se coloca en la vagina durante tres semanas y se retira en la cuarta semana. Luego se repite el proceso con un anillo nuevo. Se necesita una receta para utilizar este mtodo anticonceptivo. Anticonceptivo de emergencia Los anticonceptivos de emergencia son mtodos para evitar un embarazo despus de tener sexo sin proteccin. Vienen en forma de pldora y pueden tomarse hasta 5 das despus de tener sexo. Funcionan mejor cuando se toman lo ms pronto posible luego de tener sexo. La mayora de los anticonceptivos de emergencia estn disponibles sin receta mdica. Este mtodo no debe utilizarse como el nico mtodo anticonceptivo. Mtodos de  barrera Condn masculino Un condn masculino es una vaina delgada que se coloca sobre el pene durante el sexo. Los condones evitan que el esperma ingrese en el cuerpo de la mujer. Pueden utilizarse con un una sustancia que mata a los espermatozoides (espermicida) para aumentar la efectividad. Deben desecharse despus de un uso. Condn femenino Un condn femenino es una vaina blanda y holgada que se coloca en la vagina antes de tener sexo. El condn evita que el esperma ingrese en el cuerpo de la mujer. Deben desecharse despus de un uso. Diafragma Un diafragma es una barrera blanda con forma de cpula. Se inserta en la vagina antes del sexo, junto con un espermicida. El diafragma bloquea el ingreso de esperma en el tero, y el espermicida mata a los espermatozoides. El diafragma debe permanecer en la vagina durante 6 a 8 horas despus de tener sexo y debe retirarse en el plazo de las 24 horas. Un diafragma es recetado y colocado por un mdico. Debe reemplazarse cada 1 a 2 aos, despus de dar a luz, de aumentar ms de 15lb (6.8kg) y de una ciruga plvica. Capuchn cervical Un capuchn cervical es una copa redonda y blanda de ltex o plstico que se coloca en el cuello uterino. Se inserta en la vagina antes del sexo, junto con un espermicida. Bloquea el ingreso del esperma en el tero. El capuchn debe permanecer en el lugar durante 6 a 8 horas despus de tener sexo y debe retirarse en el plazo de las 48 horas. Un capuchn cervical debe ser recetado y colocado por un mdico. Debe reemplazarse cada 2aos. Esponja Una esponja es una pieza blanda y circular de espuma de poliuretano que contiene espermicida. La esponja ayuda a bloquear el ingreso de esperma en el tero, y el espermicida mata a   los espermatozoides. Para utilizarla, debe humedecerla e insertarla en la vagina. Debe insertarse antes de tener sexo, debe permanecer dentro al menos durante 6 horas despus de tener sexo y debe retirarse y  desecharse en el plazo de las 30 horas. Espermicidas Los espermicidas son sustancias qumicas que matan o bloquean al esperma y no lo dejan ingresar al cuello uterino y al tero. Vienen en forma de crema, gel, supositorio, espuma o comprimido. Un espermicida debe insertarse en la vagina con un aplicador al menos 10 o 15 minutos antes de tener sexo para dar tiempo a que surta efecto. El proceso debe repetirse cada vez que tenga sexo. Los espermicidas no requieren receta mdica. Anticonceptivos intrauterinos Dispositivo intrauterino (DIU) Un DIU es un dispositivo en forma de T que se coloca en el tero. Existen dos tipos: DIU hormonal.Este tipo contiene progestina, una forma sinttica de la hormona progesterona. Este tipo puede permanecer colocado durante 3 a 5 aos. DIU de cobre.Este tipo est recubierto con un alambre de cobre. Puede permanecer colocado durante 10 aos. Mtodos anticonceptivos permanentes Ligadura de trompas en la mujer En este mtodo, se sellan, atan u obstruyen las trompas de Falopio durante una ciruga para evitar que el vulo descienda hacia el tero. Esterilizacin histeroscpica En este mtodo, se coloca un implante pequeo y flexible dentro de cada trompa de Falopio. Los implantes hacen que se forme un tejido cicatricial en las trompas de Falopio y que las obstruya para que el espermatozoide no pueda llegar al vulo. El procedimiento demora alrededor de 3 meses para que sea efectivo. Debe utilizarse otro mtodo anticonceptivo durante esos 3 meses. Esterilizacin masculina Este es un procedimiento que consiste en atar los conductos que transportan el esperma (vasectoma). Luego del procedimiento, el hombre puede eyacular lquido (semen). Debe utilizarse otro mtodo anticonceptivo durante 3 meses despus del procedimiento. Mtodos de planificacin natural Planificacin familiar natural En este mtodo, la pareja no tiene sexo durante los das en que la mujer podra quedar  embarazada. Mtodo calendario En este mtodo, la mujer realiza un seguimiento de la duracin de cada ciclo menstrual, identifica los das en los que se puede producir un embarazo y no tiene sexo durante esos das. Mtodo de la ovulacin En este mtodo, la pareja evita tener sexo durante la ovulacin. Mtodo sintotrmico Este mtodo implica no tener sexo durante la ovulacin. Normalmente, la mujer comprueba la ovulacin al observar cambios en su temperatura y en la consistencia del moco cervical. Mtodo posovulacin En este mtodo, la pareja espera a que finalice la ovulacin para tener sexo. Dnde buscar ms informacin Centers for Disease Control and Prevention (Centros para el Control y la Prevencin de Enfermedades): www.cdc.gov Resumen La anticoncepcin, o los mtodos anticonceptivos, hace referencia a los mtodos o dispositivos que evitan el embarazo. Los mtodos anticonceptivos hormonales incluyen implantes, inyecciones, pastillas, parches, anillos vaginales y anticonceptivos de emergencia. Los mtodos anticonceptivos de barrera pueden incluir condones masculinos, condones femeninos, diafragmas, capuchones cervicales, esponjas y espermicidas. Existen dos tipos de DIU (dispositivo intrauterino). Un DIU puede colocarse en el tero de una mujer para evitar el embarazo durante 3 a 5 aos. La esterilizacin permanente puede realizarse mediante un procedimiento tanto en los hombres como en las mujeres. Los mtodos de planificacin familiar natural implican no tener sexo durante los das en que la mujer podra quedar embarazada. Esta informacin no tiene como fin reemplazar el consejo del mdico. Asegrese de hacerle al mdico cualquier pregunta que tenga. Document Revised: 04/07/2020 Document Reviewed: 04/07/2020 Elsevier Patient Education  2022 Elsevier   Inc.  

## 2021-04-23 NOTE — Progress Notes (Signed)
   Subjective:  Melanie Watts is a 25 y.o. G2P1001 at [redacted]w[redacted]d being seen today for ongoing prenatal care.  She is currently monitored for the following issues for this high-risk pregnancy and has Supervision of high risk pregnancy, antepartum; History of low birth weight; History of pre-eclampsia; History of C-section; Language barrier; and [redacted] weeks gestation of pregnancy on their problem list.  Patient reports no complaints.  Contractions: Not present. Vag. Bleeding: None.  Movement: Present. Denies leaking of fluid.   The following portions of the patient's history were reviewed and updated as appropriate: allergies, current medications, past family history, past medical history, past social history, past surgical history and problem list. Problem list updated.  Objective:   Vitals:   04/23/21 1047  BP: 125/75  Pulse: 68  Weight: 182 lb 11.2 oz (82.9 kg)    Fetal Status: Fetal Heart Rate (bpm): 140   Movement: Present  Presentation: Vertex  General:  Alert, oriented and cooperative. Patient is in no acute distress.  Skin: Skin is warm and dry. No rash noted.   Cardiovascular: Normal heart rate noted  Respiratory: Normal respiratory effort, no problems with respiration noted  Abdomen: Soft, gravid, appropriate for gestational age. Pain/Pressure: Absent     Pelvic: Vag. Bleeding: None     Cervical exam performed Dilation: Closed      Extremities: Normal range of motion.  Edema: None  Mental Status: Normal mood and affect. Normal behavior. Normal judgment and thought content.   Urinalysis:      Assessment and Plan:  Pregnancy: G2P1001 at [redacted]w[redacted]d  1. Supervision of high risk pregnancy, antepartum BP and FHR normal GC/CT collected recently in MAU, normal GBS today - Culture, beta strep (group b only)  2. History of pre-eclampsia On asa, BP normal  3. History of C-section Desires TOLAC, consent signed 03/10/21  4. Language barrier Spanish  Preterm labor symptoms and general  obstetric precautions including but not limited to vaginal bleeding, contractions, leaking of fluid and fetal movement were reviewed in detail with the patient. Please refer to After Visit Summary for other counseling recommendations.  Return in 1 week (on 04/30/2021) for ob visit.   Venora Maples, MD

## 2021-04-27 LAB — CULTURE, BETA STREP (GROUP B ONLY): Strep Gp B Culture: NEGATIVE

## 2021-04-29 ENCOUNTER — Ambulatory Visit (INDEPENDENT_AMBULATORY_CARE_PROVIDER_SITE_OTHER): Payer: Self-pay | Admitting: Obstetrics & Gynecology

## 2021-04-29 ENCOUNTER — Other Ambulatory Visit: Payer: Self-pay

## 2021-04-29 VITALS — BP 120/84 | HR 88 | Wt 184.7 lb

## 2021-04-29 DIAGNOSIS — Z98891 History of uterine scar from previous surgery: Secondary | ICD-10-CM

## 2021-04-29 DIAGNOSIS — O0990 Supervision of high risk pregnancy, unspecified, unspecified trimester: Secondary | ICD-10-CM

## 2021-04-29 DIAGNOSIS — Z3A37 37 weeks gestation of pregnancy: Secondary | ICD-10-CM

## 2021-04-29 DIAGNOSIS — Z8759 Personal history of other complications of pregnancy, childbirth and the puerperium: Secondary | ICD-10-CM

## 2021-04-29 DIAGNOSIS — Z789 Other specified health status: Secondary | ICD-10-CM

## 2021-04-29 NOTE — Progress Notes (Addendum)
Subjective:  Melanie Watts is a 25 y.o. G2P1001 at [redacted]w[redacted]d being seen today for prenatal care.  Patient reports no complaints.  Contractions: Irritability.  Vag. Bleeding: None. Movement: Present. Denies leaking of fluid.   The following portions of the patient's history were reviewed and updated as appropriate: allergies, current medications, past family history, past medical history, past social history, past surgical history and problem list.   Objective:   Vitals:   04/29/21 1455  BP: 120/84  Pulse: 88  Weight: 184 lb 11.2 oz (83.8 kg)    Fetal Status: Fetal Heart Rate (bpm): 140 Fundal Height: 35 cm Movement: Present     General:  Alert, oriented and cooperative. Patient is in no acute distress.  Skin: Skin is warm and dry. No rash noted.   Cardiovascular: Normal heart rate noted  Respiratory: Normal respiratory effort, no problems with respiration noted  Abdomen: Soft, gravid, appropriate for gestational age. Pain/Pressure: Present     Vaginal: Vag. Bleeding: None.       Cervix: Not evaluated per patient request.    Extremities: Normal range of motion.  Edema: Trace  Mental Status: Normal mood and affect. Normal behavior. Normal judgment and thought content.    Assessment and Plan:  Pregnancy: G2P1001 at [redacted]w[redacted]d  1. Supervision of high risk pregnancy, antepartum - Denies contractions, vaginal bleeding, abnormal discharge and is having normal fetal movement - Counseled on presenting to MAU for labor symptoms   2. [redacted] weeks gestation of pregnancy - GBS negative   3. History of C-section - VBAC  4. History of pre-eclampsia - BP have remained within normal limits throughout this pregnancy  - Patient has been taking ASA daily   5. Language barrier - In person interpretor was utilized during this visit   Term labor symptoms and general obstetric precautions including but not limited to vaginal bleeding, contractions, leaking of fluid and fetal movement were reviewed in  detail with the patient. Please refer to After Visit Summary for other counseling recommendations.  Return in about 1 week (around 05/06/2021) for HROB FU.   Athena Masse, Student-PA

## 2021-05-04 ENCOUNTER — Inpatient Hospital Stay (HOSPITAL_COMMUNITY): Payer: Medicaid Other | Admitting: Anesthesiology

## 2021-05-04 ENCOUNTER — Inpatient Hospital Stay (HOSPITAL_COMMUNITY)
Admission: AD | Admit: 2021-05-04 | Discharge: 2021-05-05 | DRG: 807 | Disposition: A | Discharge status: Home / Self Care | Payer: Medicaid Other | Attending: Family Medicine | Admitting: Family Medicine

## 2021-05-04 ENCOUNTER — Other Ambulatory Visit: Payer: Self-pay

## 2021-05-04 ENCOUNTER — Encounter (HOSPITAL_COMMUNITY): Payer: Self-pay | Admitting: Obstetrics & Gynecology

## 2021-05-04 DIAGNOSIS — O34211 Maternal care for low transverse scar from previous cesarean delivery: Secondary | ICD-10-CM

## 2021-05-04 DIAGNOSIS — O4292 Full-term premature rupture of membranes, unspecified as to length of time between rupture and onset of labor: Secondary | ICD-10-CM | POA: Diagnosis present

## 2021-05-04 DIAGNOSIS — O4202 Full-term premature rupture of membranes, onset of labor within 24 hours of rupture: Secondary | ICD-10-CM

## 2021-05-04 DIAGNOSIS — Z789 Other specified health status: Secondary | ICD-10-CM | POA: Diagnosis present

## 2021-05-04 DIAGNOSIS — Z603 Acculturation difficulty: Secondary | ICD-10-CM | POA: Diagnosis present

## 2021-05-04 DIAGNOSIS — O26893 Other specified pregnancy related conditions, third trimester: Secondary | ICD-10-CM | POA: Diagnosis present

## 2021-05-04 DIAGNOSIS — O99214 Obesity complicating childbirth: Secondary | ICD-10-CM | POA: Diagnosis present

## 2021-05-04 DIAGNOSIS — Z8759 Personal history of other complications of pregnancy, childbirth and the puerperium: Secondary | ICD-10-CM

## 2021-05-04 DIAGNOSIS — O34219 Maternal care for unspecified type scar from previous cesarean delivery: Secondary | ICD-10-CM

## 2021-05-04 DIAGNOSIS — Z3A38 38 weeks gestation of pregnancy: Secondary | ICD-10-CM | POA: Diagnosis not present

## 2021-05-04 DIAGNOSIS — Z20822 Contact with and (suspected) exposure to covid-19: Secondary | ICD-10-CM | POA: Diagnosis present

## 2021-05-04 DIAGNOSIS — Z87898 Personal history of other specified conditions: Secondary | ICD-10-CM

## 2021-05-04 DIAGNOSIS — Z98891 History of uterine scar from previous surgery: Secondary | ICD-10-CM

## 2021-05-04 DIAGNOSIS — O0990 Supervision of high risk pregnancy, unspecified, unspecified trimester: Secondary | ICD-10-CM

## 2021-05-04 LAB — CBC
HCT: 36.9 % (ref 36.0–46.0)
Hemoglobin: 12.5 g/dL (ref 12.0–15.0)
MCH: 30.6 pg (ref 26.0–34.0)
MCHC: 33.9 g/dL (ref 30.0–36.0)
MCV: 90.4 fL (ref 80.0–100.0)
Platelets: 210 10*3/uL (ref 150–400)
RBC: 4.08 MIL/uL (ref 3.87–5.11)
RDW: 13.6 % (ref 11.5–15.5)
WBC: 10.8 10*3/uL — ABNORMAL HIGH (ref 4.0–10.5)
nRBC: 0 % (ref 0.0–0.2)

## 2021-05-04 LAB — RESP PANEL BY RT-PCR (FLU A&B, COVID) ARPGX2
Influenza A by PCR: NEGATIVE
Influenza B by PCR: NEGATIVE
SARS Coronavirus 2 by RT PCR: NEGATIVE

## 2021-05-04 LAB — RPR: RPR Ser Ql: NONREACTIVE

## 2021-05-04 LAB — TYPE AND SCREEN
ABO/RH(D): A POS
Antibody Screen: NEGATIVE

## 2021-05-04 LAB — POCT FERN TEST: POCT Fern Test: POSITIVE

## 2021-05-04 MED ORDER — TERBUTALINE SULFATE 1 MG/ML IJ SOLN: 0.2500 mg | Freq: Once | INTRAMUSCULAR | Status: DC | PRN

## 2021-05-04 MED ORDER — ACETAMINOPHEN 325 MG PO TABS: 650.0000 mg | ORAL_TABLET | ORAL | Status: DC | PRN

## 2021-05-04 MED ORDER — OXYTOCIN-SODIUM CHLORIDE 30-0.9 UT/500ML-% IV SOLN
1.0000 m[IU]/min | INTRAVENOUS | Status: DC
  Administered 2021-05-04: 2 m[IU]/min via INTRAVENOUS
  Filled 2021-05-04: qty 500

## 2021-05-04 MED ORDER — IBUPROFEN 800 MG PO TABS
800.0000 mg | ORAL_TABLET | Freq: Three times a day (TID) | ORAL | Status: DC
  Administered 2021-05-04 – 2021-05-05 (×2): 800 mg via ORAL
  Filled 2021-05-04 (×2): qty 1

## 2021-05-04 MED ORDER — MEASLES, MUMPS & RUBELLA VAC IJ SOLR: 0.5000 mL | Freq: Once | INTRAMUSCULAR | Status: DC

## 2021-05-04 MED ORDER — OXYTOCIN-SODIUM CHLORIDE 30-0.9 UT/500ML-% IV SOLN: 2.5000 [IU]/h | INTRAVENOUS | Status: DC

## 2021-05-04 MED ORDER — LACTATED RINGERS IV SOLN: 500.0000 mL | INTRAVENOUS | Status: DC | PRN

## 2021-05-04 MED ORDER — ONDANSETRON HCL 4 MG/2ML IJ SOLN: 4.0000 mg | Freq: Four times a day (QID) | INTRAMUSCULAR | Status: DC | PRN

## 2021-05-04 MED ORDER — LACTATED RINGERS IV SOLN: INTRAVENOUS | Status: DC

## 2021-05-04 MED ORDER — DOCUSATE SODIUM 100 MG PO CAPS
100.0000 mg | ORAL_CAPSULE | Freq: Two times a day (BID) | ORAL | Status: DC
  Administered 2021-05-04 – 2021-05-05 (×2): 100 mg via ORAL
  Filled 2021-05-04 (×2): qty 1

## 2021-05-04 MED ORDER — LACTATED RINGERS IV SOLN: 500.0000 mL | Freq: Once | INTRAVENOUS | Status: DC

## 2021-05-04 MED ORDER — FENTANYL-BUPIVACAINE-NACL 0.5-0.125-0.9 MG/250ML-% EP SOLN
12.0000 mL/h | EPIDURAL | Status: DC | PRN
  Filled 2021-05-04: qty 250

## 2021-05-04 MED ORDER — LIDOCAINE HCL (PF) 1 % IJ SOLN: 30.0000 mL | INTRAMUSCULAR | Status: DC | PRN

## 2021-05-04 MED ORDER — DIPHENHYDRAMINE HCL 50 MG/ML IJ SOLN: 12.5000 mg | INTRAMUSCULAR | Status: DC | PRN

## 2021-05-04 MED ORDER — SENNOSIDES-DOCUSATE SODIUM 8.6-50 MG PO TABS
2.0000 | ORAL_TABLET | ORAL | Status: DC
  Administered 2021-05-05: 2 via ORAL
  Filled 2021-05-04: qty 2

## 2021-05-04 MED ORDER — FENTANYL CITRATE (PF) 100 MCG/2ML IJ SOLN: 50.0000 ug | INTRAMUSCULAR | Status: DC | PRN

## 2021-05-04 MED ORDER — EPHEDRINE 5 MG/ML INJ
10.0000 mg | INTRAVENOUS | Status: DC | PRN
  Administered 2021-05-04: 10 mg via INTRAVENOUS

## 2021-05-04 MED ORDER — OXYTOCIN BOLUS FROM INFUSION
333.0000 mL | Freq: Once | INTRAVENOUS | Status: AC
  Administered 2021-05-04: 333 mL via INTRAVENOUS

## 2021-05-04 MED ORDER — COCONUT OIL OIL: 1.0000 "application " | TOPICAL_OIL | Status: DC | PRN

## 2021-05-04 MED ORDER — EPHEDRINE 5 MG/ML INJ
10.0000 mg | INTRAVENOUS | Status: DC | PRN
  Filled 2021-05-04: qty 5

## 2021-05-04 MED ORDER — WITCH HAZEL-GLYCERIN EX PADS: 1.0000 "application " | MEDICATED_PAD | CUTANEOUS | Status: DC | PRN

## 2021-05-04 MED ORDER — BENZOCAINE-MENTHOL 20-0.5 % EX AERO
1.0000 "application " | INHALATION_SPRAY | CUTANEOUS | Status: DC | PRN
  Filled 2021-05-04: qty 56

## 2021-05-04 MED ORDER — PHENYLEPHRINE 40 MCG/ML (10ML) SYRINGE FOR IV PUSH (FOR BLOOD PRESSURE SUPPORT)
80.0000 ug | PREFILLED_SYRINGE | INTRAVENOUS | Status: DC | PRN
Start: 2021-05-04 — End: 2021-05-04

## 2021-05-04 MED ORDER — PRENATAL MULTIVITAMIN CH
1.0000 | ORAL_TABLET | Freq: Every day | ORAL | Status: DC
  Administered 2021-05-05: 1 via ORAL
  Filled 2021-05-04: qty 1

## 2021-05-04 MED ORDER — ACETAMINOPHEN 500 MG PO TABS
1000.0000 mg | ORAL_TABLET | Freq: Three times a day (TID) | ORAL | Status: DC
  Administered 2021-05-04 – 2021-05-05 (×3): 1000 mg via ORAL
  Filled 2021-05-04 (×3): qty 2

## 2021-05-04 MED ORDER — ONDANSETRON HCL 4 MG PO TABS: 4.0000 mg | ORAL_TABLET | ORAL | Status: DC | PRN

## 2021-05-04 MED ORDER — DIPHENHYDRAMINE HCL 25 MG PO CAPS: 25.0000 mg | ORAL_CAPSULE | Freq: Four times a day (QID) | ORAL | Status: DC | PRN

## 2021-05-04 MED ORDER — SOD CITRATE-CITRIC ACID 500-334 MG/5ML PO SOLN: 30.0000 mL | ORAL | Status: DC | PRN

## 2021-05-04 MED ORDER — PHENYLEPHRINE 40 MCG/ML (10ML) SYRINGE FOR IV PUSH (FOR BLOOD PRESSURE SUPPORT): 80.0000 ug | PREFILLED_SYRINGE | INTRAVENOUS | Status: DC | PRN

## 2021-05-04 MED ORDER — OXYCODONE-ACETAMINOPHEN 5-325 MG PO TABS: 1.0000 | ORAL_TABLET | ORAL | Status: DC | PRN

## 2021-05-04 MED ORDER — OXYCODONE-ACETAMINOPHEN 5-325 MG PO TABS: 2.0000 | ORAL_TABLET | ORAL | Status: DC | PRN

## 2021-05-04 MED ORDER — ONDANSETRON HCL 4 MG/2ML IJ SOLN: 4.0000 mg | INTRAMUSCULAR | Status: DC | PRN

## 2021-05-04 MED ORDER — SIMETHICONE 80 MG PO CHEW: 80.0000 mg | CHEWABLE_TABLET | ORAL | Status: DC | PRN

## 2021-05-04 MED ORDER — TETANUS-DIPHTH-ACELL PERTUSSIS 5-2.5-18.5 LF-MCG/0.5 IM SUSY: 0.5000 mL | PREFILLED_SYRINGE | Freq: Once | INTRAMUSCULAR | Status: DC

## 2021-05-04 MED ORDER — DIBUCAINE (PERIANAL) 1 % EX OINT: 1.0000 "application " | TOPICAL_OINTMENT | CUTANEOUS | Status: DC | PRN

## 2021-05-04 NOTE — Anesthesia Preprocedure Evaluation (Signed)
Anesthesia Evaluation  Patient identified by MRN, date of birth, ID band Patient awake    Reviewed: Allergy & Precautions, Patient's Chart, lab work & pertinent test results  Airway Mallampati: II  TM Distance: >3 FB Neck ROM: Full    Dental no notable dental hx.    Pulmonary neg pulmonary ROS,    Pulmonary exam normal breath sounds clear to auscultation       Cardiovascular negative cardio ROS Normal cardiovascular exam Rhythm:Regular Rate:Normal     Neuro/Psych negative neurological ROS  negative psych ROS   GI/Hepatic negative GI ROS, Neg liver ROS,   Endo/Other  Obesity BMI 34  Renal/GU negative Renal ROS  negative genitourinary   Musculoskeletal negative musculoskeletal ROS (+)   Abdominal   Peds negative pediatric ROS (+)  Hematology negative hematology ROS (+) hct 36.9, plt 210   Anesthesia Other Findings   Reproductive/Obstetrics (+) Pregnancy TOLAC                             Anesthesia Physical Anesthesia Plan  ASA: 2  Anesthesia Plan: Epidural   Post-op Pain Management:    Induction:   PONV Risk Score and Plan: 2  Airway Management Planned: Natural Airway  Additional Equipment: None  Intra-op Plan:   Post-operative Plan:   Informed Consent: I have reviewed the patients History and Physical, chart, labs and discussed the procedure including the risks, benefits and alternatives for the proposed anesthesia with the patient or authorized representative who has indicated his/her understanding and acceptance.       Plan Discussed with:   Anesthesia Plan Comments:         Anesthesia Quick Evaluation

## 2021-05-04 NOTE — Discharge Summary (Signed)
Postpartum Discharge Summary   Patient Name: Melanie Watts DOB: Dec 12, 1995 MRN: [id number]  Date of admission: 05/04/2021 Delivery date: 05/04/2021  Delivering provider: Isaias Sakai  Date of discharge: 05/05/2021  Admitting diagnosis: Indication for care in labor or delivery [O75.9] Intrauterine pregnancy: [redacted]w[redacted]d    Secondary diagnosis:  Principal Problem:   Supervision of high risk pregnancy, antepartum Active Problems:   History of pre-eclampsia   History of C-section   Language barrier   Indication for care in labor or delivery   VBAC (vaginal birth after Cesarean)  Additional problems: None    Discharge diagnosis: Term Pregnancy Delivered and VBAC                                              Post partum procedures: N/A Augmentation: Pitocin Complications: None  Hospital course: Onset of Labor With Vaginal Delivery      25y.o. yo G2P1001 at 363w3das admitted in Latent Labor on 05/04/2021. Patient had an uncomplicated labor course as follows:  Membrane Rupture Time/Date: 2:30 AM ,05/04/2021   Delivery Method:VBAC, Spontaneous  Episiotomy: None  Lacerations:  Periurethral   Patient had an uncomplicated postpartum course.  She is ambulating, tolerating a regular diet, passing flatus, and urinating well.  She was noted to have occasional BP in 130s/70s with normal BP measurements in between. Patient has a hx of preeclampsia but did not develop preeclampsia with this pregnancy and has remained asymptomatic. Will closely monitor and have BP check in 1 week. Patient is discharged home in stable condition on 05/05/21.  Newborn Data: Birth date:05/04/2021  Birth time:2:37 PM  Gender:Female  Living status:Living  Apgars:9 ,9  Weight:2805 g   Magnesium Sulfate received: No BMZ received: No Rhophylac:N/A MMR:N/A T-DaP:Given prenatally Flu: N/A Transfusion:No  Physical exam  Vitals:   05/04/21 2042 05/04/21 2108 05/05/21 0120 05/05/21 0523  BP: 131/84  136/76 117/83 131/78  Pulse: 75 65 74 69  Resp: '16  18 16  ' Temp: 98.3 F (36.8 C)  98.4 F (36.9 C) 98 F (36.7 C)  TempSrc: Oral  Oral Oral  SpO2: 99%  97% 100%  Weight:      Height:       General: alert, cooperative, and no distress Lochia: appropriate Uterine Fundus: firm DVT Evaluation: no LE edema, no calf tenderness to palpation  Labs: Lab Results  Component Value Date   WBC 10.8 (H) 05/04/2021   HGB 12.5 05/04/2021   HCT 36.9 05/04/2021   MCV 90.4 05/04/2021   PLT 210 05/04/2021   CMP Latest Ref Rng & Units 01/21/2021  Glucose 65 - 99 mg/dL 81  BUN 6 - 20 mg/dL 7  Creatinine 0.57 - 1.00 mg/dL 0.54(L)  Sodium 134 - 144 mmol/L 138  Potassium 3.5 - 5.2 mmol/L 4.0  Chloride 96 - 106 mmol/L 102  CO2 20 - 29 mmol/L 20  Calcium 8.7 - 10.2 mg/dL 9.1  Total Protein 6.0 - 8.5 g/dL 6.4  Total Bilirubin 0.0 - 1.2 mg/dL <0.2  Alkaline Phos 44 - 121 IU/L 55  AST 0 - 40 IU/L 15  ALT 0 - 32 IU/L 29   Edinburgh Score: Edinburgh Postnatal Depression Scale Screening Tool 05/04/2021  I have been able to laugh and see the funny side of things. 0  I have looked forward with enjoyment to things. 0  I  have blamed myself unnecessarily when things went wrong. 0  I have been anxious or worried for no good reason. 0  I have felt scared or panicky for no good reason. 0  Things have been getting on top of me. 1  I have been so unhappy that I have had difficulty sleeping. 0  I have felt sad or miserable. 0  I have been so unhappy that I have been crying. 0  The thought of harming myself has occurred to me. 0  Edinburgh Postnatal Depression Scale Total 1     After visit meds:  Allergies as of 05/05/2021       Reactions   Salmon Oil [nutritional Supplements] Hives        Medication List     STOP taking these medications    aspirin EC 81 MG tablet   [facility]S Prenatal Multi+DHA 27-0.8-250 MG Caps       TAKE these medications    acetaminophen 500 MG tablet Commonly known  as: TYLENOL Take 2 tablets (1,000 mg total) by mouth every 8 (eight) hours as needed (pain).   ibuprofen 200 MG tablet Commonly known as: ADVIL Take 4 tablets (800 mg total) by mouth every 8 (eight) hours as needed (pain).        Discharge home in stable condition Infant Feeding: Breast Infant Disposition: home with mother Discharge instruction: per After Visit Summary and Postpartum booklet. Activity: Advance as tolerated. Pelvic rest for 6 weeks.  Diet: routine diet Future Appointments: No future appointments.   Follow up Visit: Message sent to [facility] on 05/05/2021 by Dr. Vanetta Shawl.  Please schedule this patient for a In person postpartum visit in 4 weeks with the following provider: Any provider. Additional Postpartum F/U: BP check 1 week and Nexplanon insertion   Low risk pregnancy complicated by:  Previous cesarean Delivery mode:  VBAC, Spontaneous  Anticipated Birth Control:  Nexplanon outpatient  05/05/2021 Genia Del, MD OB Fellow  Faculty Practice

## 2021-05-04 NOTE — Lactation Note (Signed)
This note was copied from a baby's chart. Lactation Consultation Note  Patient Name: Girl Belina Mandile [id number] Date: 05/04/2021 Reason for consult: L&D Initial assessment Age:24 hours  Video interpreter used for Spanish.  P2, Ex BF.  Baby cueing.  Assisted with latching. Lactation to follow up on MBU.   Maternal Data Does the patient have breastfeeding experience prior to this delivery?: Yes How long did the patient breastfeed?: 1.5 years  Feeding Mother's Current Feeding Choice: Breast Milk  LATCH Score Latch: Grasps breast easily, tongue down, lips flanged, rhythmical sucking.  Audible Swallowing: A few with stimulation  Type of Nipple: Everted at rest and after stimulation  Comfort (Breast/Nipple): Soft / non-tender  Hold (Positioning): Assistance needed to correctly position infant at breast and maintain latch.  LATCH Score: 8  Interventions Interventions: Breast feeding basics reviewed;Assisted with latch;Skin to skin;Education  Consult Status Consult Status: Follow-up from L&D    Dahlia Byes [facility] 05/04/2021, 3:28 PM

## 2021-05-04 NOTE — Progress Notes (Signed)
Labor Progress Note Melanie Watts is a 25 y.o. G2P1001 at [redacted]w[redacted]d presented for IOL for PROM with plan for TOLAC. S: Feeling some contractions now.  O:  BP 126/87 (BP Location: Right Arm)   Pulse 67   Temp 98.2 F (36.8 C) (Oral)   Resp 17   Ht 5\' 2"  (1.575 m)   Wt 83 kg   LMP 08/08/2020   SpO2 100%   BMI 33.47 kg/m  EFM: 140/Moderate/ + Accels, - Deccels  CVE: Dilation: 1 Effacement (%): Thick Station: Ballotable Exam by:: [email or web address] RN   A&P: 25 y.o. G2P1001 [redacted]w[redacted]d  presented for IOL for PROM with plan for TOLAC. #Labor: Progressing well. Will start Pitocin.   #Pain: Epidural PRN. #FWB: Cat 1. #GBS negative  [redacted]w[redacted]d, MD 6:48 AM

## 2021-05-04 NOTE — Anesthesia Procedure Notes (Signed)
Epidural Patient location during procedure: OB Start time: 05/04/2021 9:19 AM End time: 05/04/2021 9:35 AM  Staffing Anesthesiologist: Lannie Fields, DO Performed: anesthesiologist   Preanesthetic Checklist Completed: patient identified, IV checked, risks and benefits discussed, monitors and equipment checked, pre-op evaluation and timeout performed  Epidural Patient position: sitting Prep: DuraPrep and site prepped and draped Patient monitoring: continuous pulse ox, blood pressure, heart rate and cardiac monitor Approach: midline Location: L3-L4 Injection technique: LOR air  Needle:  Needle type: Tuohy  Needle gauge: 17 G Needle length: 9 cm Needle insertion depth: 7 cm Catheter type: closed end flexible Catheter size: 19 Gauge Catheter at skin depth: 12 cm Test dose: negative  Assessment Sensory level: T8 Events: blood not aspirated, injection not painful, no injection resistance, no paresthesia and negative IV test  Additional Notes 1st catheter heme +, replaced at same level without issue Reason for block:procedure for pain

## 2021-05-04 NOTE — H&P (Addendum)
OBSTETRIC ADMISSION HISTORY AND PHYSICAL  Melanie Watts is a 25 y.o. female G2P1001 with IUP at [redacted]w[redacted]d by LMP presenting for SROM at 0230. She reports +FMs, no LOF, no VB, no blurry vision, headache, peripheral edema, or RUQ pain.  She plans on breast feeding. She request outpatient Nexplanon for birth control.  She received her prenatal care at  [facility]    Cesarean delivery with first pregnancy, patient would like to TOLAC (consent signed 03/10/21)  Dating: By LMP --->  Estimated Date of Delivery: 05/15/21  Sono:   @[redacted]w[redacted]d , CWD, normal anatomy, cephalic presentation, anterior placenta lie, 2584g, 15% EFW   Prenatal History/Complications:  Hx of CS Hx of preeclampsia Hx of low birth weight   Past Medical History: Past Medical History:  Diagnosis Date   Preeclampsia 2015   At 30 weeks   SGA (small for gestational age) 59   Pts first child    Past Surgical History: Past Surgical History:  Procedure Laterality Date   APPENDECTOMY     Surgery was performed in 25 in 2020   CESAREAN SECTION      Obstetrical History: OB History     Gravida  2   Para  1   Term  1   Preterm      AB      Living  1      SAB      IAB      Ectopic      Multiple      Live Births  1           Social History Social History   Socioeconomic History   Marital status: Married    Spouse name: Melanie Watts   Number of children: Not on file   Years of education: Not on file   Highest education level: Not on file  Occupational History   Not on file  Tobacco Use   Smoking status: Never   Smokeless tobacco: Never  Vaping Use   Vaping Use: Never used  Substance and Sexual Activity   Alcohol use: Never   Drug use: Never   Sexual activity: Yes    Birth control/protection: Implant    Comment: nexplanon  Other Topics Concern   Not on file  Social History Narrative   Not on file   Social Determinants of Health   Financial Resource Strain: Not on file  Food  Insecurity: No Food Insecurity   Worried About Running Out of Food in the Last Year: Never true   Ran Out of Food in the Last Year: Never true  Transportation Needs: No Transportation Needs   Lack of Transportation (Medical): No   Lack of Transportation (Non-Medical): No  Physical Activity: Not on file  Stress: Not on file  Social Connections: Not on file    Family History: History reviewed. No pertinent family history.  Allergies: Allergies  Allergen Reactions   Salmon Oil [Nutritional Supplements] Hives    Medications Prior to Admission  Medication Sig Dispense Refill Last Dose   aspirin EC 81 MG tablet Take 1 tablet (81 mg total) by mouth daily. Take after 12 weeks for prevention of preeclampsia later in pregnancy 300 tablet 2 05/03/2021   Prenatal MV-Min-Fe Fum-FA-DHA ([employer] PRENATAL MULTI+DHA) 27-0.8-250 MG CAPS Take 1 capsule by mouth daily. 60 capsule 1 05/03/2021     Review of Systems  All systems reviewed and negative except as stated in HPI  Blood pressure 126/87, pulse 67, temperature 98.2 F (36.8 C), temperature source  Oral, resp. rate 17, height 5\' 2"  (1.575 m), weight 83 kg, last menstrual period 08/08/2020, SpO2 100 %.  General appearance: alert, cooperative, and no distress Lungs: no increased work of breathing Extremities: no swelling, no sign of DVT Abdomen: gravid; nontender to palpation  Presentation: cephalic Fetal monitoringBaseline: 140 bpm, Variability: Good {> 6 bpm), Accelerations: Reactive, and Decelerations: Absent Uterine activity: Patient denies feeling any contractions, irregular on monitor   Prenatal labs: ABO, Rh: --/--/A POS (08/16 0324) Antibody: NEG (08/16 0324) Rubella: Immune (02/21 0000) RPR: Non Reactive (06/02 0826)  HBsAg: Negative (02/21 0000)  HIV: Non Reactive (06/02 0826)  GBS: Negative/-- (08/05 1058)  2 hr Glucola normal Genetic screening  Neg Quad screen, NIPS low risk Anatomy 10-11-1983 Normal   Prenatal Transfer Tool   Maternal Diabetes: No Genetic Screening: Normal Maternal Ultrasounds/Referrals: Normal Fetal Ultrasounds or other Referrals:  None Maternal Substance Abuse:  No Significant Maternal Medications:  ASA Significant Maternal Lab Results: Group B Strep negative  Results for orders placed or performed during the hospital encounter of 05/04/21 (from the past 24 hour(s))  POCT fern test   Collection Time: 05/04/21  3:03 AM  Result Value Ref Range   POCT Fern Test Positive = ruptured amniotic membanes   Resp Panel by RT-PCR (Flu A&B, Covid) Nasopharyngeal Swab   Collection Time: 05/04/21  3:24 AM   Specimen: Nasopharyngeal Swab; Nasopharyngeal(NP) swabs in vial transport medium  Result Value Ref Range   SARS Coronavirus 2 by RT PCR NEGATIVE NEGATIVE   Influenza A by PCR NEGATIVE NEGATIVE   Influenza B by PCR NEGATIVE NEGATIVE  Type and screen [facility]   Collection Time: 05/04/21  3:24 AM  Result Value Ref Range   ABO/RH(D) A POS    Antibody Screen NEG    Sample Expiration      05/07/2021,2359 Performed at [facility], [address], [place], [place]   CBC   Collection Time: 05/04/21  3:31 AM  Result Value Ref Range   WBC 10.8 (H) 4.0 - 10.5 K/uL   RBC 4.08 3.87 - 5.11 MIL/uL   Hemoglobin 12.5 12.0 - 15.0 g/dL   HCT 05/06/21 70.3 - 50.0 %   MCV 90.4 80.0 - 100.0 fL   MCH 30.6 26.0 - 34.0 pg   MCHC 33.9 30.0 - 36.0 g/dL   RDW 93.8 18.2 - 99.3 %   Platelets 210 150 - 400 K/uL   nRBC 0.0 0.0 - 0.2 %    Patient Active Problem List   Diagnosis Date Noted   Indication for care in labor or delivery 05/04/2021   [redacted] weeks gestation of pregnancy 03/10/2021   Language barrier 02/18/2021   Supervision of high risk pregnancy, antepartum 01/13/2021   History of low birth weight 01/13/2021   History of pre-eclampsia 01/13/2021   History of C-section 01/13/2021    Assessment/Plan:  Melanie Watts is a 25 y.o. G2P1001 at [redacted]w[redacted]d here for SROM at  0230.  #Labor: Patient had cesarean delivery with first pregnancy, she would like to [facility]. Patient is contracting every few minutes since SROM and is requesting expectant management. Will plan to check at 4 hour mark after SROM. Will plan to augment with Pitocin and FB as appropriate next check. #Pain: PRN, considering epidural #FWB: Cat 1 #ID:  GBS negative #MOF: Breast feeding #MOC: Outpatient Nexplanon #Circ:  N/a baby girl  [facility], Medical Student  05/04/2021, 6:11 AM   GME ATTESTATION:  I saw  and evaluated the patient. I agree with the findings and the plan of care as documented in the resident's note. I have made changes to documentation as necessary.   SROM at 0230. Contracting every few minutes. Will plan to check at 4 hours post SROM and reassess. Plan to augment as needed.  Evalina Field, MD OB Fellow, Faculty [facility], Center for [facility] 05/04/2021 9:30 AM

## 2021-05-04 NOTE — MAU Note (Signed)
..  Melanie Watts is a 25 y.o. at [redacted]w[redacted]d here in MAU reporting: Leaking of fluid since 2:30am. Denies contractions or vaginal bleeding. +FM

## 2021-05-05 ENCOUNTER — Encounter: Payer: Self-pay | Admitting: Family Medicine

## 2021-05-05 MED ORDER — ACETAMINOPHEN 500 MG PO TABS: 1000.0000 mg | ORAL_TABLET | Freq: Three times a day (TID) | ORAL | 0 refills | Status: DC | PRN

## 2021-05-05 MED ORDER — IBUPROFEN 200 MG PO TABS: 800.0000 mg | ORAL_TABLET | Freq: Three times a day (TID) | ORAL | Status: DC | PRN

## 2021-05-05 NOTE — Progress Notes (Addendum)
Post Partum Day 1 Subjective: no complaints, up ad lib, voiding, tolerating PO, and + flatus. Lochia normal. Patient is recovering well and . She will be receiving outpatient Nexplanon and is breast feeding her baby.  Objective: Blood pressure 131/78, pulse 69, temperature 98 F (36.7 C), temperature source Oral, resp. rate 16, height 5\' 2"  (1.575 m), weight 83 kg, last menstrual period 08/08/2020, SpO2 100 %, unknown if currently breastfeeding.  Physical Exam:  General: alert, cooperative, and no distress Lochia: appropriate Uterine Fundus: firm DVT Evaluation: no LE edema, no calf tenderness to palpation  Recent Labs    05/04/21 0331  HGB 12.5  HCT 36.9    Assessment/Plan: -Patient is meeting all postpartum milestones. -Patient is breastfeeding and will receive an OP Nexplanon for contraception.  -Plan for discharge today if pediatrics signs off on baby's discharge.   LOS: 1 day   Gema J Rodriguez-Teodoro 05/05/2021, 8:09 AM   GME ATTESTATION:  I saw and evaluated the patient. I agree with the findings and the plan of care as documented in the resident's note. I have made changes to documentation as necessary.  Meeting all milestones postpartum. Plan for discharge today.  05/07/2021, MD OB Fellow, Faculty [facility], Center for [facility], [facility] 05/05/2021 9:43 AM

## 2021-05-05 NOTE — Lactation Note (Addendum)
This note was copied from a baby's chart. Lactation Consultation Note  Patient Name: Melanie Watts [id number] Date: 05/05/2021   Age:24 hours   LC talked with mother with assistance of spanish translator Melanie Watts [phone]. LC order placed today in infant's chart. LC assisted Mom latching infant in football with deeper latch, flanging out lips. Mom stated pain with latch decreased 4 to 1 with changes made. Mom to use EBM for nipple care and RN to provided coconut oil  Plan 1. To feed based on cues 8-12x n 24 hr period 2. Mom to offer breast, flange out lips, place infant STS and look for signs of milk transfer.  3. If unable to latch, Mom to offer EBM via spoon  4. [facility] brochure reviewed of inpatient and outpatient services available.   All questions answered at the end of the visit.   Maternal Data    Feeding    LATCH Score                    Lactation Tools Discussed/Used    Interventions    Discharge    Consult Status      Melanie Kil  Watts 05/05/2021, 12:31 PM

## 2021-05-06 NOTE — Anesthesia Postprocedure Evaluation (Signed)
Anesthesia Post Note  Patient: Melanie Watts  Procedure(s) Performed: AN AD HOC LABOR EPIDURAL     Patient location during evaluation: Mother Baby Anesthesia Type: Epidural Level of consciousness: awake Pain management: satisfactory to patient Vital Signs Assessment: post-procedure vital signs reviewed and stable Respiratory status: spontaneous breathing Cardiovascular status: stable Anesthetic complications: no   No notable events documented.  Last Vitals:  Vitals:   05/05/21 0120 05/05/21 0523  BP: 117/83 131/78  Pulse: 74 69  Resp: 18 16  Temp: 36.9 C 36.7 C  SpO2: 97% 100%    Last Pain:  Vitals:   05/05/21 1214  TempSrc:   PainSc: 0-No pain   Pain Goal:       Pt d/c to home.  Post op visit via phone call with [facility] interpreter            [facility]

## 2021-05-13 ENCOUNTER — Ambulatory Visit (INDEPENDENT_AMBULATORY_CARE_PROVIDER_SITE_OTHER): Payer: Self-pay

## 2021-05-13 ENCOUNTER — Other Ambulatory Visit: Payer: Self-pay

## 2021-05-13 VITALS — BP 130/88 | HR 78 | Wt 170.7 lb

## 2021-05-13 DIAGNOSIS — O34219 Maternal care for unspecified type scar from previous cesarean delivery: Secondary | ICD-10-CM

## 2021-05-13 DIAGNOSIS — Z8759 Personal history of other complications of pregnancy, childbirth and the puerperium: Secondary | ICD-10-CM

## 2021-05-13 DIAGNOSIS — Z0130 Encounter for examination of blood pressure without abnormal findings: Secondary | ICD-10-CM

## 2021-05-13 NOTE — Progress Notes (Signed)
Pt here today for PP BP check. Pt denies any headaches, swelling or visual changes. Pt had VBAC on 05/04/21. Pt currently not taking any BP meds. Hx of Pre-E, but not with this pregnancy.   BP: 130/88  Pt advised to continue to monitor symptoms and to call if has increased in headaches, swelling or visual changes. Pt verbalized understanding. Pt has PP visit on 9/21 and is agreeable to date and time of appt. Pt also plans to get Nexplanon as birth control.   Judeth Cornfield, RN

## 2021-05-13 NOTE — Progress Notes (Signed)
Patient was assessed and managed by nursing staff during this encounter. I have reviewed the chart and agree with the documentation and plan. I have also made any necessary editorial changes.  Catalina Antigua, MD 05/13/2021 11:30 AM

## 2021-05-14 ENCOUNTER — Telehealth (HOSPITAL_COMMUNITY): Payer: Self-pay

## 2021-05-14 NOTE — Telephone Encounter (Signed)
"  After I pee and I contract my muscles it hurts. I'm not sure if it's from my stitches?" Patient declines burning with urination, frequency, urgency, foul vaginal ordor, or vaginal drainage. "It just hurts when I contract my muscles after I pee." RN reviewed peri care with patient and healing process. RN told patient that If she feels she is having increased pain in her perineum to contact her OB. RN also told patient if she is concerned that she can call her OB and they will be happy to assess and make sure everything is healing ok. Patient has no other questions or concerns about her healing.  "Baby is doing good. Eating a lot. Baby sleeps in a crib."  RN reviewed ABC's of safe sleep with patient. Patient declines any questions or concerns about baby.  EPDS score is 0.  Marcelino Duster [facility] 05/14/2021,1813

## 2021-05-25 ENCOUNTER — Telehealth: Payer: Self-pay

## 2021-05-25 NOTE — Telephone Encounter (Signed)
[facility] nurse called office and left VM reporting patient is complaining of pain after stopping urine stream. No burning while urinating. Pt stated that no medication helps, rates pain 4/10. States this feels like a muscle contracting, also states it feels like the bone. Pt reports to RN that stitches itch, but do not cause any pain.

## 2021-05-26 NOTE — Telephone Encounter (Signed)
Pt returned phone call. Pt reports not taking pain medication. Encouraged pt to take Tylenol and ibuprofen as prescribed. Instructed pt to rotate Tylenol and ibuprofen every 4 hours for 24 hours and follow up with office if pain continues.

## 2021-05-26 NOTE — Telephone Encounter (Signed)
Called pt with interpreter Eda x2. Unable to leave a VM. Patient may follow up as needed.

## 2021-06-09 ENCOUNTER — Other Ambulatory Visit: Payer: Self-pay

## 2021-06-09 ENCOUNTER — Encounter: Payer: Self-pay | Admitting: Family Medicine

## 2021-06-09 ENCOUNTER — Ambulatory Visit (INDEPENDENT_AMBULATORY_CARE_PROVIDER_SITE_OTHER): Payer: Self-pay | Admitting: Family Medicine

## 2021-06-09 DIAGNOSIS — O34219 Maternal care for unspecified type scar from previous cesarean delivery: Secondary | ICD-10-CM

## 2021-06-09 NOTE — Progress Notes (Signed)
Post Partum Visit Note  Melanie Watts is a 25 y.o. G37P2002 female who presents for a postpartum visit. She is 5 weeks postpartum following a normal spontaneous vaginal delivery.  I have fully reviewed the prenatal and intrapartum course. The delivery was at [redacted]w[redacted]d gestational weeks.  Anesthesia: epidural. Postpartum course has been uneventful. Baby is doing well. Baby is feeding by breast. Bleeding no bleeding. Bowel function is normal. Bladder function is concerns with urination, does not indorse painful urination. Patient is not sexually active. Contraception method is none. Postpartum depression screening: negative.   The pregnancy intention screening data noted above was reviewed. Potential methods of contraception were discussed. The patient elected to proceed with Nexplanon.   Edinburgh Postnatal Depression Scale - 06/09/21 1324       Edinburgh Postnatal Depression Scale:  In the Past 7 Days   I have been able to laugh and see the funny side of things. 3    I have looked forward with enjoyment to things. 0    I have blamed myself unnecessarily when things went wrong. 0    I have been anxious or worried for no good reason. 0    I have felt scared or panicky for no good reason. 0    Things have been getting on top of me. 0    I have been so unhappy that I have had difficulty sleeping. 0    I have felt sad or miserable. 0    I have been so unhappy that I have been crying. 0    The thought of harming myself has occurred to me. 0    Edinburgh Postnatal Depression Scale Total 3             Health Maintenance Due  Topic Date Due   COVID-19 Vaccine (1) Never done   HPV VACCINES (1 - 2-dose series) Never done   INFLUENZA VACCINE  04/19/2021    The following portions of the patient's history were reviewed and updated as appropriate: allergies, current medications, past family history, past medical history, past social history, past surgical history, and problem list.  Review  of Systems Pertinent items noted in HPI and remainder of comprehensive ROS otherwise negative.  Objective:  BP 114/72   Pulse (!) 59   Ht 5' 1.81" (1.57 m)   Wt 170 lb 6.4 oz (77.3 kg)   LMP  (LMP Unknown)   Breastfeeding Yes   BMI 31.36 kg/m    General:  alert, cooperative, and appears stated age   Breasts:  not indicated  Lungs: Comfortable on room air  Wound N/a  GU exam:  not indicated       Assessment:    There are no diagnoses linked to this encounter.  Normal postpartum exam.   Plan:   Essential components of care per ACOG recommendations:  1.  Mood and well being: Patient with negative depression screening today. Reviewed local resources for support.  - Patient tobacco use? No.   - hx of drug use? No.    2. Infant care and feeding:  -Patient currently breastmilk feeding? Yes. Reviewed importance of draining breast regularly to support lactation.  -Social determinants of health (SDOH) reviewed in EPIC. No concerns  3. Sexuality, contraception and birth spacing - Patient does not want a pregnancy in the next year.  Desired family size is unsure number of children.  - Reviewed forms of contraception in tiered fashion. Patient desired  Nexplanon at [facility] due to insurance issues .   -  Discussed birth spacing of 18 months  4. Sleep and fatigue -Encouraged family/partner/community support of 4 hrs of uninterrupted sleep to help with mood and fatigue  5. Physical Recovery  - Discussed patients delivery and complications. She describes her labor as good. - Patient had a  VBAC . Patient had a 1st degree laceration. Perineal healing reviewed. Patient expressed understanding - Patient has urinary incontinence? No. - Patient is safe to resume physical and sexual activity  6.  Health Maintenance - HM due items addressed No - up to date - Last pap smear No results found for: DIAGPAP Pap smear not done at today's visit.  -Breast Cancer screening indicated? No.   7.  Chronic Disease/Pregnancy Condition follow up: None  - PCP follow up  Venora Maples, MD Center for [facility], [facility]

## 2022-11-18 ENCOUNTER — Ambulatory Visit (INDEPENDENT_AMBULATORY_CARE_PROVIDER_SITE_OTHER): Payer: Self-pay | Admitting: Student

## 2022-11-18 ENCOUNTER — Encounter: Payer: Self-pay | Admitting: Student

## 2022-11-18 VITALS — BP 103/60 | HR 63 | Temp 98.9°F | Ht 62.0 in | Wt 185.6 lb

## 2022-11-18 DIAGNOSIS — T8332XA Displacement of intrauterine contraceptive device, initial encounter: Secondary | ICD-10-CM

## 2022-11-18 DIAGNOSIS — N898 Other specified noninflammatory disorders of vagina: Secondary | ICD-10-CM

## 2022-11-18 NOTE — Patient Instructions (Signed)
It was great to see you! Thank you for allowing me to participate in your care!  I recommend that you always bring your medications to each appointment as this makes it easy to ensure you are on the correct medications and helps [place] not miss when refills are needed.  Our plans for today:  - Ped una ecografa para buscarte IUD. Te llamar con [employer] Vernell Barrier, recomiendo usar condones como anticonceptivo.   Take care and seek immediate care sooner if you develop any concerns.   Dr. Precious Gilding, DO [facility]

## 2022-11-18 NOTE — Progress Notes (Unsigned)
    SUBJECTIVE:   CHIEF COMPLAINT / HPI:   Pt presents to establish care and discuss the following  Lost IUD strings 2 weeks ago had a mild intermittent pain in lower abdomen for 3-4 days which has since resolved.  5 days later she had some vaginal bleeding which seemed like a period and lasted for about a week.  This is the first "period" she has had since her IUD was placed.  She had an IUD placed over a year ago [facility], she was able to feel the strings 2 months ago but was unable to feel them about a week ago.   Vaginal Discharge For past 2 months, white discharge which has turned darker in color. Denies and vaginal irritation or itching. Denies a fishy odor. No fever, nausea or vomiting.1 partner, doesn't use condoms.  Is interested in STD testing to be safe but does not have insurance at this time.   OBJECTIVE:   BP 103/60   Pulse 63   Temp 98.9 F (37.2 C)   Ht '5\' 2"'$  (1.575 m)   Wt 185 lb 9.6 oz (84.2 kg)   SpO2 100%   BMI 33.95 kg/m    General: NAD, pleasant, able to participate in exam Cardiac: Well-perfused Respiratory: Breathing comfortably on room air GU: Chaperoned by CMA.  Normal external female genitalia with no warts, lesions.  Moist pink vaginal mucosa.  Cervix with ectropion and no lesions, masses, ulcers.  Moderate amount of whitish/clear discharge.  No cervical motion tenderness.  No IUD strings noted from cervical os. Skin: warm and dry, no rashes noted Neuro: alert, no obvious focal deficits Psych: Normal affect and mood  ASSESSMENT/PLAN:   Vaginal discharge As patient does not have insurance, shared decision-making was used and she will defer STD testing/wet prep today.  I feel this is reasonable as exam was benign.  She is currently filling out financial aid packet.  She plans to return for testing when she has financial assistance.  She was also advised that she can go to the health department for free STD testing if desired.  IUD  threads lost No IUD strings noted on exam.  Transvaginal ultrasound ordered to look for IUD/evidence of uterine perforation.  Patient advised to use condoms for contraception in the meantime.  After ultrasound results return, we will proceed with alternative contraceptive planning if needed.     Dr. Precious Gilding, [facility]

## 2022-11-19 DIAGNOSIS — T8332XA Displacement of intrauterine contraceptive device, initial encounter: Secondary | ICD-10-CM | POA: Insufficient documentation

## 2022-11-19 DIAGNOSIS — N898 Other specified noninflammatory disorders of vagina: Secondary | ICD-10-CM | POA: Insufficient documentation

## 2022-11-19 NOTE — Assessment & Plan Note (Signed)
As patient does not have insurance, shared decision-making was used and she will defer STD testing/wet prep today.  I feel this is reasonable as exam was benign.  She is currently filling out financial aid packet.  She plans to return for testing when she has financial assistance.  She was also advised that she can go to the health department for free STD testing if desired.

## 2022-11-19 NOTE — Assessment & Plan Note (Addendum)
No IUD strings noted on exam.  Transvaginal ultrasound ordered to look for IUD/evidence of uterine perforation.  Patient advised to use condoms for contraception in the meantime.  After ultrasound results return, we will proceed with alternative contraceptive planning if needed.

## 2022-11-21 ENCOUNTER — Ambulatory Visit (HOSPITAL_COMMUNITY)
Admission: RE | Admit: 2022-11-21 | Discharge: 2022-11-21 | Disposition: A | Discharge status: Home / Self Care | Payer: Self-pay | Source: Ambulatory Visit | Attending: Family Medicine | Admitting: Family Medicine

## 2022-11-21 DIAGNOSIS — T8332XA Displacement of intrauterine contraceptive device, initial encounter: Secondary | ICD-10-CM | POA: Insufficient documentation

## 2022-11-23 ENCOUNTER — Telehealth: Payer: Self-pay | Admitting: Student

## 2022-11-23 NOTE — Telephone Encounter (Signed)
Called pt with interpreter to discuss US findings which  showed that IUD is in the lower segment of the uterus. Advised pt if she has any more lower abdominal pain which may be related to the IUD being positioned low, we could always replace it. She voiced understanding.

## 2023-05-11 ENCOUNTER — Encounter (HOSPITAL_COMMUNITY): Payer: Self-pay

## 2023-05-11 ENCOUNTER — Ambulatory Visit (HOSPITAL_COMMUNITY)
Admission: EM | Admit: 2023-05-11 | Discharge: 2023-05-11 | Disposition: A | Discharge status: Home / Self Care | Payer: Self-pay | Attending: Family Medicine | Admitting: Family Medicine

## 2023-05-11 DIAGNOSIS — Z975 Presence of (intrauterine) contraceptive device: Secondary | ICD-10-CM | POA: Insufficient documentation

## 2023-05-11 DIAGNOSIS — Z1152 Encounter for screening for COVID-19: Secondary | ICD-10-CM | POA: Insufficient documentation

## 2023-05-11 DIAGNOSIS — R509 Fever, unspecified: Secondary | ICD-10-CM | POA: Insufficient documentation

## 2023-05-11 DIAGNOSIS — B349 Viral infection, unspecified: Secondary | ICD-10-CM | POA: Insufficient documentation

## 2023-05-11 LAB — POCT INFLUENZA A/B
Influenza A, POC: NEGATIVE
Influenza B, POC: NEGATIVE

## 2023-05-11 MED ORDER — IBUPROFEN 600 MG PO TABS: 600.0000 mg | ORAL_TABLET | Freq: Three times a day (TID) | ORAL | 0 refills | Status: AC | PRN

## 2023-05-11 MED ORDER — ACETAMINOPHEN 325 MG PO TABS
650.0000 mg | ORAL_TABLET | Freq: Once | ORAL | Status: AC
  Administered 2023-05-11: 650 mg via ORAL

## 2023-05-11 MED ORDER — ACETAMINOPHEN 325 MG PO TABS
ORAL_TABLET | ORAL | Status: AC
  Filled 2023-05-11: qty 2

## 2023-05-11 NOTE — Discharge Instructions (Addendum)
The flu test was negative.   You have been swabbed for COVID, and the test will result in the next 24 hours. Our staff will call you if positive. If the COVID test is positive, you should quarantine until you are fever free for 24 hours and you are starting to feel better, and then take added precautions for the next 5 days, such as physical distancing/wearing a mask and good hand hygiene/washing.  Take ibuprofen 600 mg--1 tab every 8 hours as needed for pain(or fever)   Make sure you are drinking enough fluids

## 2023-05-11 NOTE — ED Provider Notes (Addendum)
[facility]    CSN: [id number] Arrival date & time: 05/11/23  1533      History   Chief Complaint Chief Complaint  Patient presents with   Fever    HPI Melanie Watts is a 27 y.o. female.    Fever Here for fever and myalgia and malaise.  Symptoms began on August 19.  He has had chills and lots of myalgia.  Also she has had some headache.  No congestion or cough.  No nausea or vomiting or diarrhea.  She was exposed to several people who have ended up being sick at a birthday party on August 17.  No known diagnosis  She has an IUD and has not had a period since it was placed  Past Medical History:  Diagnosis Date   Preeclampsia 2015   At 30 weeks   SGA (small for gestational age) 26   Pts first child    Patient Active Problem List   Diagnosis Date Noted   IUD threads lost 11/19/2022   Vaginal discharge 11/19/2022   Indication for care in labor or delivery 05/04/2021   VBAC (vaginal birth after Cesarean) 05/04/2021   [redacted] weeks gestation of pregnancy 03/10/2021   Language barrier 02/18/2021   Supervision of high risk pregnancy, antepartum 01/13/2021   History of low birth weight 01/13/2021   History of pre-eclampsia 01/13/2021   History of C-section 01/13/2021    Past Surgical History:  Procedure Laterality Date   APPENDECTOMY     Surgery was performed in [place] in 2020   CESAREAN SECTION      OB History     Gravida  2   Para  2   Term  2   Preterm      AB      Living  2      SAB      IAB      Ectopic      Multiple  0   Live Births  2            Home Medications    Prior to Admission medications   Medication Sig Start Date End Date Taking? Authorizing Provider  ibuprofen (ADVIL) 600 MG tablet Take 1 tablet (600 mg total) by mouth every 8 (eight) hours as needed for fever (pain). 05/11/23  Yes Zenia Resides, MD    Family History History reviewed. No pertinent family history.  Social History Social History    Tobacco Use   Smoking status: Never   Smokeless tobacco: Never  Vaping Use   Vaping status: Never Used  Substance Use Topics   Alcohol use: Yes    Comment: rare   Drug use: Never     Allergies   Salmon oil [nutritional supplements]   Review of Systems Review of Systems  Constitutional:  Positive for fever.     Physical Exam Triage Vital Signs ED Triage Vitals  Encounter Vitals Group     BP 05/11/23 1558 123/79     Systolic BP Percentile --      Diastolic BP Percentile --      Pulse Rate 05/11/23 1558 (!) 110     Resp 05/11/23 1558 16     Temp 05/11/23 1558 (!) 102.9 F (39.4 C)     Temp Source 05/11/23 1558 Oral     SpO2 05/11/23 1558 96 %     Weight --      Height 05/11/23 1557 5' 2.21" (1.58 m)     Head  Circumference --      Peak Flow --      Pain Score 05/11/23 1557 8     Pain Loc --      Pain Education --      Exclude from Growth Chart --    No data found.  Updated Vital Signs BP 123/79 (BP Location: Left Arm)   Pulse (!) 110   Temp (!) 102.9 F (39.4 C) (Oral)   Resp 16   Ht 5' 2.21" (1.58 m)   SpO2 96%   Breastfeeding No   BMI 33.72 kg/m   Visual Acuity Right Eye Distance:   Left Eye Distance:   Bilateral Distance:    Right Eye Near:   Left Eye Near:    Bilateral Near:     Physical Exam Vitals reviewed.  Constitutional:      General: She is not in acute distress.    Appearance: She is not ill-appearing, toxic-appearing or diaphoretic.  HENT:     Nose: Nose normal.     Mouth/Throat:     Mouth: Mucous membranes are moist.     Pharynx: No oropharyngeal exudate or posterior oropharyngeal erythema.  Eyes:     Extraocular Movements: Extraocular movements intact.     Conjunctiva/sclera: Conjunctivae normal.     Pupils: Pupils are equal, round, and reactive to light.  Cardiovascular:     Rate and Rhythm: Normal rate and regular rhythm.     Heart sounds: No murmur heard. Pulmonary:     Effort: Pulmonary effort is normal. No  respiratory distress.     Breath sounds: Normal breath sounds. No stridor. No wheezing, rhonchi or rales.  Musculoskeletal:     Cervical back: Neck supple.  Lymphadenopathy:     Cervical: No cervical adenopathy.  Skin:    Coloration: Skin is not jaundiced or pale.  Neurological:     General: No focal deficit present.     Mental Status: She is alert and oriented to person, place, and time.  Psychiatric:        Behavior: Behavior normal.      UC Treatments / Results  Labs (all labs ordered are listed, but only abnormal results are displayed) Labs Reviewed  SARS CORONAVIRUS 2 (TAT 6-24 HRS)  POCT INFLUENZA A/B    EKG   Radiology No results found.  Procedures Procedures (including critical care time)  Medications Ordered in UC Medications  acetaminophen (TYLENOL) tablet 650 mg (650 mg Oral Given 05/11/23 1607)    Initial Impression / Assessment and Plan / UC Course  I have reviewed the triage vital signs and the nursing notes.  Pertinent labs & imaging results that were available during my care of the patient were reviewed by me and considered in my medical decision making (see chart for details).        Flu test is negative.  COVID swab is done and we will notify her if positive; she will know if she needs to isolate if positive.  Ibuprofen sent in for her pain fever Final Clinical Impressions(s) / UC Diagnoses   Final diagnoses:  Viral syndrome  Fever, unspecified fever cause     Discharge Instructions      The flu test was negative.   You have been swabbed for COVID, and the test will result in the next 24 hours. Our staff will call you if positive. If the COVID test is positive, you should quarantine until you are fever free for 24 hours and you are starting to  feel better, and then take added precautions for the next 5 days, such as physical distancing/wearing a mask and good hand hygiene/washing.  Take ibuprofen 600 mg--1 tab every 8 hours as  needed for pain(or fever)   Make sure you are drinking enough fluids      ED Prescriptions     Medication Sig Dispense Auth. Provider   ibuprofen (ADVIL) 600 MG tablet Take 1 tablet (600 mg total) by mouth every 8 (eight) hours as needed for fever (pain). 15 tablet Patience Nuzzo, Janace Aris, MD      PDMP not reviewed this encounter.   Zenia Resides, MD 05/11/23 1639    Zenia Resides, MD 05/11/23 [phone]

## 2023-05-11 NOTE — ED Triage Notes (Signed)
Patient here today with c/o fever, chills, body aches, and headaches since Monday. She has tried taking IBU with no improvement. No sick contacts. Her daughter had a birthday party on Saturday and she was around a lot of people.

## 2023-05-12 LAB — SARS CORONAVIRUS 2 (TAT 6-24 HRS): SARS Coronavirus 2: NEGATIVE

## 2023-05-13 ENCOUNTER — Ambulatory Visit (HOSPITAL_COMMUNITY)
Admission: EM | Admit: 2023-05-13 | Discharge: 2023-05-13 | Disposition: A | Discharge status: Home / Self Care | Payer: Self-pay | Attending: Internal Medicine | Admitting: Internal Medicine

## 2023-05-13 ENCOUNTER — Encounter (HOSPITAL_COMMUNITY): Payer: Self-pay | Admitting: Emergency Medicine

## 2023-05-13 ENCOUNTER — Other Ambulatory Visit: Payer: Self-pay

## 2023-05-13 DIAGNOSIS — B349 Viral infection, unspecified: Secondary | ICD-10-CM

## 2023-05-13 DIAGNOSIS — G44209 Tension-type headache, unspecified, not intractable: Secondary | ICD-10-CM

## 2023-05-13 MED ORDER — METHYLPREDNISOLONE ACETATE 40 MG/ML IJ SUSP
40.0000 mg | Freq: Once | INTRAMUSCULAR | Status: AC
  Administered 2023-05-13: 40 mg via INTRAMUSCULAR

## 2023-05-13 MED ORDER — KETOROLAC TROMETHAMINE 30 MG/ML IJ SOLN
INTRAMUSCULAR | Status: AC
  Filled 2023-05-13: qty 1

## 2023-05-13 MED ORDER — METHYLPREDNISOLONE ACETATE 40 MG/ML IJ SUSP
INTRAMUSCULAR | Status: AC
  Filled 2023-05-13: qty 1

## 2023-05-13 MED ORDER — KETOROLAC TROMETHAMINE 30 MG/ML IJ SOLN
30.0000 mg | Freq: Once | INTRAMUSCULAR | Status: AC
  Administered 2023-05-13: 30 mg via INTRAMUSCULAR

## 2023-05-13 NOTE — Discharge Instructions (Signed)
Patient received injection of Medrol and Toradol to help with headache.  Recommend using Tylenol every 6-8 hours as needed for fever.  May use heating pad and alternate with ice on the left groin.  Should the symptoms worsen or fail to improve patient should return to urgent care or see primary care.

## 2023-05-13 NOTE — ED Provider Notes (Signed)
[facility]    CSN: [id number] Arrival date & time: 05/13/23  1156      History   Chief Complaint Chief Complaint  Patient presents with   Fever   Abscess    HPI Kayren Kissling is a 27 y.o. female.   27 year old female who presents to urgent care with complaints of continued fever and fatigue.  She was at urgent care with similar symptoms on August 22 but her COVID and flu test were negative.  She also relates a painful area in her left groin.  She reports that all of her symptoms started about 5 days ago with headache and pain in the left groin.  She denies shortness of breath, nausea, vomiting, urinary symptoms, vaginal symptoms or other constitutional symptoms.   Fever Associated symptoms: headaches (Occipital)   Associated symptoms: no chest pain, no chills, no congestion, no cough, no dysuria, no ear pain, no rash, no sore throat and no vomiting   Abscess Associated symptoms: fatigue, fever and headaches (Occipital)   Associated symptoms: no vomiting     Past Medical History:  Diagnosis Date   Preeclampsia 2015   At 30 weeks   SGA (small for gestational age) 38   Pts first child    Patient Active Problem List   Diagnosis Date Noted   IUD threads lost 11/19/2022   Vaginal discharge 11/19/2022   Indication for care in labor or delivery 05/04/2021   VBAC (vaginal birth after Cesarean) 05/04/2021   [redacted] weeks gestation of pregnancy 03/10/2021   Language barrier 02/18/2021   Supervision of high risk pregnancy, antepartum 01/13/2021   History of low birth weight 01/13/2021   History of pre-eclampsia 01/13/2021   History of C-section 01/13/2021    Past Surgical History:  Procedure Laterality Date   APPENDECTOMY     Surgery was performed in [place] in 2020   CESAREAN SECTION      OB History     Gravida  2   Para  2   Term  2   Preterm      AB      Living  2      SAB      IAB      Ectopic      Multiple  0   Live Births  2             Home Medications    Prior to Admission medications   Medication Sig Start Date End Date Taking? Authorizing Provider  ibuprofen (ADVIL) 600 MG tablet Take 1 tablet (600 mg total) by mouth every 8 (eight) hours as needed for fever (pain). 05/11/23   Zenia Resides, MD    Family History History reviewed. No pertinent family history.  Social History Social History   Tobacco Use   Smoking status: Never   Smokeless tobacco: Never  Vaping Use   Vaping status: Never Used  Substance Use Topics   Alcohol use: Yes    Comment: rare   Drug use: Never     Allergies   Salmon oil [nutritional supplements]   Review of Systems Review of Systems  Constitutional:  Positive for activity change, fatigue and fever. Negative for chills.  HENT:  Negative for congestion, ear pain and sore throat.   Eyes:  Negative for pain and visual disturbance.  Respiratory:  Negative for cough and shortness of breath.   Cardiovascular:  Negative for chest pain and palpitations.  Gastrointestinal:  Negative for abdominal pain and vomiting.  Genitourinary:  Negative for dysuria and hematuria.  Musculoskeletal:  Negative for arthralgias and back pain.       Painful area in the left groin  Skin:  Negative for color change and rash.  Neurological:  Positive for headaches (Occipital). Negative for seizures and syncope.  All other systems reviewed and are negative.    Physical Exam Triage Vital Signs ED Triage Vitals  Encounter Vitals Group     BP 05/13/23 1242 113/71     Systolic BP Percentile --      Diastolic BP Percentile --      Pulse Rate 05/13/23 1242 71     Resp 05/13/23 1242 18     Temp 05/13/23 1242 98.2 F (36.8 C)     Temp Source 05/13/23 1242 Oral     SpO2 05/13/23 1242 98 %     Weight 05/13/23 1243 187 lb 6.3 oz (85 kg)     Height 05/13/23 1243 5\' 2"  (1.575 m)     Head Circumference --      Peak Flow --      Pain Score 05/13/23 1242 7     Pain Loc --      Pain  Education --      Exclude from Growth Chart --    No data found.  Updated Vital Signs BP 113/71 (BP Location: Right Arm)   Pulse 71   Temp 98.2 F (36.8 C) (Oral)   Resp 18   Ht 5\' 2"  (1.575 m)   Wt 187 lb 6.3 oz (85 kg)   SpO2 98%   BMI 34.27 kg/m   Visual Acuity Right Eye Distance:   Left Eye Distance:   Bilateral Distance:    Right Eye Near:   Left Eye Near:    Bilateral Near:     Physical Exam Vitals and nursing note reviewed.  Constitutional:      General: She is not in acute distress.    Appearance: She is well-developed.  HENT:     Head: Normocephalic and atraumatic.     Right Ear: Tympanic membrane normal.     Left Ear: Tympanic membrane normal.     Nose: Nose normal.     Mouth/Throat:     Mouth: Mucous membranes are moist.  Eyes:     Conjunctiva/sclera: Conjunctivae normal.  Cardiovascular:     Rate and Rhythm: Normal rate and regular rhythm.     Heart sounds: No murmur heard. Pulmonary:     Effort: Pulmonary effort is normal. No respiratory distress.     Breath sounds: Normal breath sounds.  Abdominal:     Palpations: Abdomen is soft.     Tenderness: There is no abdominal tenderness.  Musculoskeletal:        General: No swelling.     Cervical back: Neck supple.  Skin:    General: Skin is warm and dry.     Capillary Refill: Capillary refill takes less than 2 seconds.       Neurological:     General: No focal deficit present.     Mental Status: She is alert and oriented to person, place, and time.  Psychiatric:        Mood and Affect: Mood normal.      UC Treatments / Results  Labs (all labs ordered are listed, but only abnormal results are displayed) Labs Reviewed - No data to display  EKG   Radiology No results found.  Procedures Procedures (including critical care time)  Medications Ordered  in UC Medications  ketorolac (TORADOL) 30 MG/ML injection 30 mg (has no administration in time range)  methylPREDNISolone acetate  (DEPO-MEDROL) injection 40 mg (has no administration in time range)    Initial Impression / Assessment and Plan / UC Course  I have reviewed the triage vital signs and the nursing notes.  Pertinent labs & imaging results that were available during my care of the patient were reviewed by me and considered in my medical decision making (see chart for details).     Viral syndrome with headache: We given the patient an injection of Toradol as well as Medrol to help with the headache.  Explained to the patient that her symptoms are likely associated with a viral syndrome but not bacterial therefore no antibiotic is indicated.  She does have an isolated tender area in the left groin that appears to be an enlarged lymph node that is inflamed.  As this is sudden onset we would allow time for the viral syndrome to resolve and if the area remains then recommend that the patient see her PCP for possible imaging. Final Clinical Impressions(s) / UC Diagnoses   Final diagnoses:  None   Discharge Instructions   None    ED Prescriptions   None    PDMP not reviewed this encounter.   Landis Martins, [place] 05/13/23 1412

## 2023-05-13 NOTE — ED Triage Notes (Signed)
Pt c/o fever and chills since last Monday with increase HA for a week. Pt is c/o a very painful abscess on her left leg. Per pt she tested negative for covid and flu on Monday.

## 2023-08-14 ENCOUNTER — Emergency Department (HOSPITAL_COMMUNITY): Payer: Self-pay

## 2023-08-14 ENCOUNTER — Encounter (HOSPITAL_COMMUNITY): Payer: Self-pay

## 2023-08-14 ENCOUNTER — Emergency Department (HOSPITAL_COMMUNITY)
Admission: EM | Admit: 2023-08-14 | Discharge: 2023-08-14 | Disposition: A | Discharge status: Home / Self Care | Payer: Self-pay | Attending: Emergency Medicine | Admitting: Emergency Medicine

## 2023-08-14 ENCOUNTER — Other Ambulatory Visit: Payer: Self-pay

## 2023-08-14 ENCOUNTER — Ambulatory Visit
Admission: EM | Admit: 2023-08-14 | Discharge: 2023-08-14 | Disposition: A | Discharge status: Home / Self Care | Payer: Self-pay

## 2023-08-14 DIAGNOSIS — T43611A Poisoning by caffeine, accidental (unintentional), initial encounter: Secondary | ICD-10-CM | POA: Insufficient documentation

## 2023-08-14 DIAGNOSIS — R0602 Shortness of breath: Secondary | ICD-10-CM

## 2023-08-14 DIAGNOSIS — R0682 Tachypnea, not elsewhere classified: Secondary | ICD-10-CM

## 2023-08-14 DIAGNOSIS — F419 Anxiety disorder, unspecified: Secondary | ICD-10-CM | POA: Insufficient documentation

## 2023-08-14 DIAGNOSIS — R002 Palpitations: Secondary | ICD-10-CM

## 2023-08-14 LAB — RAPID URINE DRUG SCREEN, HOSP PERFORMED
Amphetamines: NOT DETECTED
Barbiturates: NOT DETECTED
Benzodiazepines: NOT DETECTED
Cocaine: NOT DETECTED
Opiates: NOT DETECTED
Tetrahydrocannabinol: NOT DETECTED

## 2023-08-14 LAB — CBC WITH DIFFERENTIAL/PLATELET
Abs Immature Granulocytes: 0.04 10*3/uL (ref 0.00–0.07)
Basophils Absolute: 0.1 10*3/uL (ref 0.0–0.1)
Basophils Relative: 1 %
Eosinophils Absolute: 0 10*3/uL (ref 0.0–0.5)
Eosinophils Relative: 0 %
HCT: 42.8 % (ref 36.0–46.0)
Hemoglobin: 14.2 g/dL (ref 12.0–15.0)
Immature Granulocytes: 0 %
Lymphocytes Relative: 17 %
Lymphs Abs: 1.7 10*3/uL (ref 0.7–4.0)
MCH: 28.9 pg (ref 26.0–34.0)
MCHC: 33.2 g/dL (ref 30.0–36.0)
MCV: 87.2 fL (ref 80.0–100.0)
Monocytes Absolute: 0.3 10*3/uL (ref 0.1–1.0)
Monocytes Relative: 3 %
Neutro Abs: 7.8 10*3/uL — ABNORMAL HIGH (ref 1.7–7.7)
Neutrophils Relative %: 79 %
Platelets: 298 10*3/uL (ref 150–400)
RBC: 4.91 MIL/uL (ref 3.87–5.11)
RDW: 13.3 % (ref 11.5–15.5)
WBC: 9.8 10*3/uL (ref 4.0–10.5)
nRBC: 0 % (ref 0.0–0.2)

## 2023-08-14 LAB — SALICYLATE LEVEL: Salicylate Lvl: <7 mg/dL — ABNORMAL LOW (ref 7.0–30.0)

## 2023-08-14 LAB — COMPREHENSIVE METABOLIC PANEL
ALT: 26 U/L (ref 0–44)
AST: 25 U/L (ref 15–41)
Albumin: 4.3 g/dL (ref 3.5–5.0)
Alkaline Phosphatase: 58 U/L (ref 38–126)
Anion gap: 13 (ref 5–15)
BUN: 11 mg/dL (ref 6–20)
CO2: 19 mmol/L — ABNORMAL LOW (ref 22–32)
Calcium: 10 mg/dL (ref 8.9–10.3)
Chloride: 103 mmol/L (ref 98–111)
Creatinine, Ser: 0.83 mg/dL (ref 0.44–1.00)
GFR, Estimated: >60 mL/min (ref >60)
Glucose, Bld: 126 mg/dL — ABNORMAL HIGH (ref 70–99)
Potassium: 3.6 mmol/L (ref 3.5–5.1)
Sodium: 135 mmol/L (ref 135–145)
Total Bilirubin: 0.4 mg/dL (ref <1.2)
Total Protein: 8.1 g/dL (ref 6.5–8.1)

## 2023-08-14 LAB — ACETAMINOPHEN LEVEL: Acetaminophen (Tylenol), Serum: <10 ug/mL — ABNORMAL LOW (ref 10–30)

## 2023-08-14 LAB — D-DIMER, QUANTITATIVE: D-Dimer, Quant: 0.47 ug{FEU}/mL (ref 0.00–0.50)

## 2023-08-14 LAB — ETHANOL: Alcohol, Ethyl (B): <10 mg/dL (ref <10)

## 2023-08-14 LAB — TROPONIN I (HIGH SENSITIVITY)
Troponin I (High Sensitivity): <2 ng/L (ref <18)
Troponin I (High Sensitivity): 4 ng/L (ref <18)

## 2023-08-14 LAB — HCG, SERUM, QUALITATIVE: Preg, Serum: NEGATIVE

## 2023-08-14 MED ORDER — ONDANSETRON HCL 4 MG PO TABS: 4.0000 mg | ORAL_TABLET | Freq: Four times a day (QID) | ORAL | 0 refills | Status: AC

## 2023-08-14 NOTE — ED Notes (Signed)
Patient is being discharged from the Urgent Care and sent to the Emergency Department via POV . Per Dorann Ou, PA, patient is in need of higher level of care due to SOB after taking caffeine pills. Patient is aware and verbalizes understanding of plan of care.  Vitals:   08/14/23 0900  BP: 137/80  Pulse: 85  Resp: (!) 40  SpO2: 99%

## 2023-08-14 NOTE — ED Notes (Signed)
Pt transported to Xray.

## 2023-08-14 NOTE — ED Provider Notes (Signed)
[facility]    CSN: [id number] Arrival date & time: 08/14/23  1914      History   Chief Complaint Chief Complaint  Patient presents with   Shortness of Breath    HPI Melanie Watts is a 27 y.o. female.   Patient presents today companied by her husband.  She is Spanish-speaking and interpreter was utilized during visit.  Reports that at 2 AM she took 2 "blackjack stacker pills" and then immediately started developing symptoms.  Review of this indicates that there are a variety of herbs as well as 250 mg of caffeine per tablet.  Patient has taken caffeine supplements over-the-counter but never this formulation.  She denies any additional eating or drinking and only had a glass of milk a few hours ago.  She reports significant shortness of breath, heart racing, trembling, nausea.  She has not had any vomiting.  She denies any significant past medical history including arrhythmias.  She has no concern for pregnancy as she has an IUD.    Past Medical History:  Diagnosis Date   Preeclampsia 2015   At 30 weeks   SGA (small for gestational age) 38   Pts first child    Patient Active Problem List   Diagnosis Date Noted   IUD threads lost 11/19/2022   Vaginal discharge 11/19/2022   Indication for care in labor or delivery 05/04/2021   VBAC (vaginal birth after Cesarean) 05/04/2021   [redacted] weeks gestation of pregnancy 03/10/2021   Language barrier 02/18/2021   Supervision of high risk pregnancy, antepartum 01/13/2021   History of low birth weight 01/13/2021   History of pre-eclampsia 01/13/2021   History of C-section 01/13/2021    Past Surgical History:  Procedure Laterality Date   APPENDECTOMY     Surgery was performed in [place] in 2020   CESAREAN SECTION      OB History     Gravida  2   Para  2   Term  2   Preterm      AB      Living  2      SAB      IAB      Ectopic      Multiple  0   Live Births  2            Home Medications     Prior to Admission medications   Medication Sig Start Date End Date Taking? Authorizing Provider  ibuprofen (ADVIL) 600 MG tablet Take 1 tablet (600 mg total) by mouth every 8 (eight) hours as needed for fever (pain). 05/11/23   Zenia Resides, MD    Family History No family history on file.  Social History Social History   Tobacco Use   Smoking status: Never   Smokeless tobacco: Never  Vaping Use   Vaping status: Never Used  Substance Use Topics   Alcohol use: Yes    Comment: rare   Drug use: Never     Allergies   Salmon oil [nutritional supplements]   Review of Systems Review of Systems  Constitutional:  Positive for activity change. Negative for appetite change, fatigue and fever.  Respiratory:  Positive for shortness of breath. Negative for cough.   Cardiovascular:  Positive for chest pain and palpitations. Negative for leg swelling.  Gastrointestinal:  Positive for nausea. Negative for abdominal pain, diarrhea and vomiting.  Neurological:  Positive for weakness (generalized) and numbness. Negative for dizziness, light-headedness and headaches.     Physical  Exam Triage Vital Signs ED Triage Vitals [08/14/23 0900]  Encounter Vitals Group     BP 137/80     Systolic BP Percentile      Diastolic BP Percentile      Pulse Rate 85     Resp (!) 40     Temp      Temp src      SpO2 99 %     Weight      Height      Head Circumference      Peak Flow      Pain Score 0     Pain Loc      Pain Education      Exclude from Growth Chart    No data found.  Updated Vital Signs BP 137/80 (BP Location: Right Arm)   Pulse 85   Resp (!) 40   SpO2 99%   Visual Acuity Right Eye Distance:   Left Eye Distance:   Bilateral Distance:    Right Eye Near:   Left Eye Near:    Bilateral Near:     Physical Exam Vitals reviewed.  Constitutional:      General: She is awake. She is not in acute distress.    Appearance: Normal appearance. She is well-developed. She is  ill-appearing.     Comments: Appears stated age anxious and lying on exam table with EKG leads attached  HENT:     Head: Normocephalic and atraumatic.  Cardiovascular:     Rate and Rhythm: Normal rate and regular rhythm.     Heart sounds: Normal heart sounds, S1 normal and S2 normal. No murmur heard. Pulmonary:     Effort: Pulmonary effort is normal.     Breath sounds: Normal breath sounds. No wheezing, rhonchi or rales.     Comments: Clear to auscultation bilaterally Abdominal:     Palpations: Abdomen is soft.     Tenderness: There is no abdominal tenderness.  Psychiatric:        Behavior: Behavior is cooperative.      UC Treatments / Results  Labs (all labs ordered are listed, but only abnormal results are displayed) Labs Reviewed - No data to display  EKG   Radiology No results found.  Procedures Procedures (including critical care time)  Medications Ordered in UC Medications - No data to display  Initial Impression / Assessment and Plan / UC Course  I have reviewed the triage vital signs and the nursing notes.  Pertinent labs & imaging results that were available during my care of the patient were reviewed by me and considered in my medical decision making (see chart for details).     Patient was tachypneic and ill-appearing but in no acute distress.  EKG was obtained that showed normal sinus rhythm with sinus arrhythmia with ventricular rate of 70 bpm with T wave inversion of lead III otherwise no significant abnormalities or ischemic changes.  No previous to compare.  Given her constellation of symptoms I did recommend she go to the emergency room for stat blood work and ongoing monitoring for concern for caffeine overdose.  We offered transport by EMS but patient declined this and will have her husband (who accompanied her to the visit today) transport her immediately to the emergency room.  She was stable at the time of discharge.  We did discuss that if anything  worsens during transport and she starts vomiting or having chest pain/increased shortness of breath they should call 911 for assistance.  Final  Clinical Impressions(s) / UC Diagnoses   Final diagnoses:  Accidental caffeine overdose, initial encounter (HCC)  SOB (shortness of breath)  Tachypnea  Palpitations   Discharge Instructions   None    ED Prescriptions   None    PDMP not reviewed this encounter.   Jeani Hawking, PA-C 08/14/23 0102

## 2023-08-14 NOTE — ED Triage Notes (Signed)
Pt and husband went on a trip to [place]. Pt states she took 2 "energy pills". Pt states she has been feeling bad every since. Pt states she has been having panic attacks with chest pain in the center of the chest that feels like pressure. Pt states that numbness and a cold feeling is radiating to both arms.

## 2023-08-14 NOTE — ED Triage Notes (Signed)
Pt took 2 "blackjack stacker pills" around 2 am. Pt clammy, tachypneic upon arrival. States she has been feeling worse over the last 1 hour. Video interpreter Maureen Ralphs [phone] assisting with triage

## 2023-08-14 NOTE — Discharge Instructions (Addendum)
You were seen in the emergency department for your chest pain and nausea after taking her caffeine pills.  Your symptoms are likely due to a caffeine overdose causing some pain and nausea need to hyperventilate.  Your symptoms improved as the caffeine wore off and you likely feel somewhat of a crash from the caffeine at this point.  The caffeine should be mostly out of your system.  You should avoid any caffeine for the rest of the day and make sure that you check the dosages of caffeine and do not take more than the daily recommended amount.  You should drink plenty of fluids and stay well-hydrated.  You can take Tylenol or an antacid as needed for chest pains and I have given you prescription of Zofran to take as needed for nausea.  You should return to the emergency department for worsening chest pain, severe shortness of breath, you pass out or any other new or concerning symptoms.  Lo atendieron en el departamento de emergencias por dolor en el pecho y nuseas despus de tomar sus pastillas de cafena.  Es probable que sus sntomas se deban a una sobredosis de cafena que le provoca algo de dolor y nuseas que requieren hiperventilacin.  Sus sntomas mejoraron a medida que la cafena desapareci y es probable que en este punto sienta una especie de colapso debido a la cafena.  La cafena debera estar prcticamente eliminada de tu sistema.  Debes evitar la cafena durante el resto del da y asegurarte de comprobar las dosis de cafena y no tomar ms de la cantidad diaria recomendada.  Debe beber muchos lquidos y [employer] hidratado.  Puede tomar Tylenol o un anticido segn sea necesario para los dolores de pecho y Chief Executive Officer he recetado Zofran para que lo tome segn sea necesario para las nuseas.  Debe regresar al departamento de emergencias si el dolor en el pecho empeora, tiene dificultad para respirar grave, se desmaya o cualquier otro sntoma nuevo o preocupante.

## 2023-08-14 NOTE — ED Notes (Signed)
Pt's husband had package of pills "Stacker 2 Black Jax Daisey Must Energy! Extreme Energizer". Each pill contains: 250mg  caffeine and 121mg  proprietary blend of yerba mate leaf powder, taurine, garcinia cambogia fruit extract, cayenne fruit powder, yohimbe bark extract

## 2023-08-14 NOTE — ED Provider Notes (Signed)
[place] EMERGENCY DEPARTMENT AT [facility] Provider Note   CSN: [id number] Arrival date & time: 08/14/23  0930     History  Chief Complaint  Patient presents with   Chest Pain    Melanie Watts is a 27 y.o. female.  Patient is a 27 year old female with no significant past medical history presenting to the emergency department with concern for caffeine overdose.  The patient states that she was driving in the car last night about 8 to 9 hours and took 2 caffeine pills that each contain 250 mg of caffeine.  She denies any coingestions or other caffeine.  She states that she started to feel short of breath with chest pain and nausea.  She states that she feels like her arms and legs are tingling.  She states that she took these around 2 AM.  She denies any history of VTE, recent hospitalization or surgery or hormone use.  Was initially seen at urgent care and was referred here for further evaluation.  She states that she took these medications to stay awake and was not attempting to hurt or kill herself.  The history is provided by the patient and the spouse. A language interpreter was used (Spanish ID number [id number]).  Chest Pain      Home Medications Prior to Admission medications   Medication Sig Start Date End Date Taking? Authorizing Provider  ondansetron (ZOFRAN) 4 MG tablet Take 1 tablet (4 mg total) by mouth every 6 (six) hours. 08/14/23  Yes Theresia Lo, Benetta Spar K, DO  ibuprofen (ADVIL) 600 MG tablet Take 1 tablet (600 mg total) by mouth every 8 (eight) hours as needed for fever (pain). 05/11/23   Zenia Resides, MD      Allergies    Salmon oil [nutritional supplements]    Review of Systems   Review of Systems  Cardiovascular:  Positive for chest pain.    Physical Exam Updated Vital Signs BP 129/71   Pulse 63   Temp 98.7 F (37.1 C) (Oral)   Resp 17   SpO2 100%  Physical Exam Vitals and nursing note reviewed.  Constitutional:      General: She  is not in acute distress.    Appearance: She is well-developed. She is not diaphoretic.  HENT:     Head: Normocephalic and atraumatic.  Eyes:     Extraocular Movements: Extraocular movements intact.     Pupils: Pupils are equal, round, and reactive to light.  Cardiovascular:     Rate and Rhythm: Normal rate and regular rhythm.     Heart sounds: Normal heart sounds.  Pulmonary:     Effort: Pulmonary effort is normal.     Breath sounds: Normal breath sounds.  Abdominal:     Palpations: Abdomen is soft.     Tenderness: There is no abdominal tenderness.  Musculoskeletal:        General: Normal range of motion.     Cervical back: Normal range of motion and neck supple.     Right lower leg: No edema.     Left lower leg: No edema.  Skin:    General: Skin is warm and dry.  Neurological:     General: No focal deficit present.     Mental Status: She is alert and oriented to person, place, and time.     Motor: No weakness.     Comments: No sensory deficits in all 4 extremities  Psychiatric:        Mood and Affect:  Mood is anxious.        Behavior: Behavior normal.     ED Results / Procedures / Treatments   Labs (all labs ordered are listed, but only abnormal results are displayed) Labs Reviewed  CBC WITH DIFFERENTIAL/PLATELET - Abnormal; Notable for the following components:      Result Value   Neutro Abs 7.8 (*)    All other components within normal limits  COMPREHENSIVE METABOLIC PANEL - Abnormal; Notable for the following components:   CO2 19 (*)    Glucose, Bld 126 (*)    All other components within normal limits  SALICYLATE LEVEL - Abnormal; Notable for the following components:   Salicylate Lvl <7.0 (*)    All other components within normal limits  ACETAMINOPHEN LEVEL - Abnormal; Notable for the following components:   Acetaminophen (Tylenol), Serum <10 (*)    All other components within normal limits  HCG, SERUM, QUALITATIVE  ETHANOL  RAPID URINE DRUG SCREEN, HOSP  PERFORMED  D-DIMER, QUANTITATIVE (NOT AT [facility])  TROPONIN I (HIGH SENSITIVITY)  TROPONIN I (HIGH SENSITIVITY)    EKG EKG Interpretation Date/Time:  Monday August 14 2023 10:43:49 EST Ventricular Rate:  62 PR Interval:  140 QRS Duration:  97 QT Interval:  391 QTC Calculation: 397 R Axis:   7  Text Interpretation: Sinus rhythm Probable left ventricular hypertrophy Since last tracing of earlier today No significant change was found Confirmed by Elayne Snare ([id number]) on 08/14/2023 11:48:07 AM  Radiology DG Chest 2 View  Result Date: 08/14/2023 CLINICAL DATA:  Shortness of breath. EXAM: CHEST - 2 VIEW COMPARISON:  None Available. FINDINGS: The heart size and mediastinal contours are within normal limits. Low lung volumes are seen. Both lungs are clear. Gaseous distention of stomach and colon noted in the upper abdomen. IMPRESSION: Low lung volumes. No active cardiopulmonary disease. Gaseous distention of stomach and colon. Electronically Signed   By: Danae Orleans M.D.   On: 08/14/2023 10:49    Procedures Procedures    Medications Ordered in ED Medications - No data to display  ED Course/ Medical Decision Making/ A&P Clinical Course as of 08/14/23 1353  Mon Aug 14, 2023  1222 D-dimer negative making PE unlikely. Vitals signs improved, it has been 10 hrs from caffeine ingestion and suspect symptoms of caffeine are starting to improve. Repeat troponin is pending. Remainder of the labs within normal range. No signs of coingestion. [VK]  1350 Patient reports symptoms improved, just feels generally weak, likely from crash from caffeine intake. She is tolerating PO. Repeat troponin negative. She is stable for discharge home with outpatient follow up. [VK]    Clinical Course User Index [VK] Rexford Maus, DO                                 Medical Decision Making This patient presents to the ED with chief complaint(s) of pain, shortness of breath with no pertinent past  medical history which further complicates the presenting complaint. The complaint involves an extensive differential diagnosis and also carries with it a high risk of complications and morbidity.    The differential diagnosis includes caffeine overdose, ACS, arrhythmia, anemia, dehydration, electrolyte abnormality, COVID ingestion, considering PE with recent long car ride  Additional history obtained: Additional history obtained from family Records reviewed urgent care records  ED Course and Reassessment: On patient's arrival she was initially tachycardic and hypertensive, vitals normalized on her evaluation in  the room.  Patient will have workup including EKG and labs to evaluate for etiology of her symptoms.  She not have a large enough ingestion of caffeine to suspect significant toxicity and heart is in a normal rate and rhythm on the monitor at this time.  She will continue to be closely reassessed.  Independent labs interpretation:  The following labs were independently interpreted: Within normal range  Independent visualization of imaging: - I independently visualized the following imaging with scope of interpretation limited to determining acute life threatening conditions related to emergency care: Chest x-ray, which revealed no acute disease  Consultation: - Consulted or discussed management/test interpretation w/ external professional: N/A  Consideration for admission or further workup: Patient has no emergent conditions requiring admission or further work-up at this time and is stable for discharge home with primary care follow-up  Social Determinants of health: N/A    Amount and/or Complexity of Data Reviewed Labs: ordered. Radiology: ordered.  Risk Prescription drug management.          Final Clinical Impression(s) / ED Diagnoses Final diagnoses:  Accidental caffeine overdose, initial encounter [facility])    Rx / DC Orders ED Discharge Orders          Ordered     ondansetron (ZOFRAN) 4 MG tablet  Every 6 hours        08/14/23 1353              Rexford Maus, DO 08/14/23 1353

## 2024-02-27 ENCOUNTER — Encounter: Payer: Self-pay | Admitting: *Deleted
